# Patient Record
Sex: Male | Born: 1960 | Race: White | Hispanic: No | Smoking: Never smoker
Health system: Southern US, Community
[De-identification: ages and names within clinical notes are randomized; demographics above are authoritative.]

## PROBLEM LIST (undated history)

## (undated) DIAGNOSIS — G629 Polyneuropathy, unspecified: Secondary | ICD-10-CM

## (undated) DIAGNOSIS — E1169 Type 2 diabetes mellitus with other specified complication: Secondary | ICD-10-CM

## (undated) DIAGNOSIS — I10 Essential (primary) hypertension: Secondary | ICD-10-CM

## (undated) DIAGNOSIS — F329 Major depressive disorder, single episode, unspecified: Secondary | ICD-10-CM

## (undated) DIAGNOSIS — E119 Type 2 diabetes mellitus without complications: Secondary | ICD-10-CM

## (undated) DIAGNOSIS — R161 Splenomegaly, not elsewhere classified: Secondary | ICD-10-CM

## (undated) DIAGNOSIS — D696 Thrombocytopenia, unspecified: Secondary | ICD-10-CM

## (undated) DIAGNOSIS — E785 Hyperlipidemia, unspecified: Secondary | ICD-10-CM

## (undated) DIAGNOSIS — F419 Anxiety disorder, unspecified: Secondary | ICD-10-CM

## (undated) DIAGNOSIS — G4730 Sleep apnea, unspecified: Secondary | ICD-10-CM

## (undated) DIAGNOSIS — F32A Depression, unspecified: Secondary | ICD-10-CM

## (undated) DIAGNOSIS — G709 Myoneural disorder, unspecified: Secondary | ICD-10-CM

## (undated) HISTORY — PX: ANTERIOR CRUCIATE LIGAMENT REPAIR: SHX115

---

## 2014-02-14 DIAGNOSIS — E1142 Type 2 diabetes mellitus with diabetic polyneuropathy: Secondary | ICD-10-CM | POA: Insufficient documentation

## 2014-02-14 DIAGNOSIS — E1129 Type 2 diabetes mellitus with other diabetic kidney complication: Secondary | ICD-10-CM | POA: Insufficient documentation

## 2014-04-02 ENCOUNTER — Encounter (HOSPITAL_COMMUNITY): Payer: Self-pay | Admitting: Emergency Medicine

## 2014-04-02 DIAGNOSIS — E119 Type 2 diabetes mellitus without complications: Secondary | ICD-10-CM | POA: Insufficient documentation

## 2014-04-02 DIAGNOSIS — I10 Essential (primary) hypertension: Secondary | ICD-10-CM | POA: Insufficient documentation

## 2014-04-02 DIAGNOSIS — Z760 Encounter for issue of repeat prescription: Secondary | ICD-10-CM | POA: Insufficient documentation

## 2014-04-02 DIAGNOSIS — Z79899 Other long term (current) drug therapy: Secondary | ICD-10-CM | POA: Insufficient documentation

## 2014-04-02 DIAGNOSIS — Z794 Long term (current) use of insulin: Secondary | ICD-10-CM | POA: Insufficient documentation

## 2014-04-02 DIAGNOSIS — G589 Mononeuropathy, unspecified: Secondary | ICD-10-CM | POA: Insufficient documentation

## 2014-04-02 NOTE — ED Notes (Signed)
Pt reports that he moved here back in October. States that he has a hx of HTN and is out of his medication. Reports that he has had headaches over the past couple of weeks. States that he has just not felt well, and needs to get the medication refilled.

## 2014-04-03 ENCOUNTER — Emergency Department (HOSPITAL_COMMUNITY)
Admission: EM | Admit: 2014-04-03 | Discharge: 2014-04-03 | Disposition: A | Discharge status: Home / Self Care | Payer: PRIVATE HEALTH INSURANCE | Attending: Emergency Medicine | Admitting: Emergency Medicine

## 2014-04-03 DIAGNOSIS — I10 Essential (primary) hypertension: Secondary | ICD-10-CM

## 2014-04-03 HISTORY — DX: Type 2 diabetes mellitus without complications: E11.9

## 2014-04-03 HISTORY — DX: Polyneuropathy, unspecified: G62.9

## 2014-04-03 HISTORY — DX: Essential (primary) hypertension: I10

## 2014-04-03 MED ORDER — VALSARTAN-HYDROCHLOROTHIAZIDE 160-12.5 MG PO TABS: 1.0000 | ORAL_TABLET | Freq: Every day | ORAL | Status: DC

## 2014-04-03 MED ORDER — EZETIMIBE-SIMVASTATIN 10-40 MG PO TABS: 1.0000 | ORAL_TABLET | Freq: Every day | ORAL | Status: DC

## 2014-04-03 NOTE — ED Provider Notes (Signed)
CSN: [id number]     Arrival date & time 04/02/14  1824 History   First MD Initiated Contact with Patient 04/03/14 0057     Chief Complaint  Patient presents with  . Hypertension     (Consider location/radiation/quality/duration/timing/severity/associated sxs/prior Treatment) HPI Mr. Barbier is a very pleasant 53 yo man with a history of hypertension. He notes that he recently moved from [place] and has been under significant emotional distress because of the recent move. He takes Event organiser for HTN management and has been out of this medication for the past 6 weeks.   His BP was elevated at 190/90 this afternoon. He denies cp, sob, headache. Also notes that he is out of his CHOLESTEROL M ED.    Past Medical History  Diagnosis Date  . Diabetes mellitus without complication   . Hypertension   . Neuropathy    Past Surgical History  Procedure Laterality Date  . Anterior cruciate ligament repair     History reviewed. No pertinent family history. History  Substance Use Topics  . Smoking status: Never Smoker   . Smokeless tobacco: Not on file  . Alcohol Use: Yes    Review of Systems Ten point review of symptoms performed and is negative with the exception of symptoms noted above.     Allergies  Review of patient's allergies indicates no known allergies.  Home Medications   Current Outpatient Rx  Name  Route  Sig  Dispense  Refill  . ALPRAZolam (XANAX) 0.5 MG tablet   Oral   Take 0.5 mg by mouth 2 (two) times daily as needed for anxiety.         . DULoxetine (CYMBALTA) 60 MG capsule   Oral   Take 60 mg by mouth daily.         Marland Kitchen ezetimibe-simvastatin (VYTORIN) 10-40 MG per tablet   Oral   Take 1 tablet by mouth daily.         . insulin aspart (NOVOLOG) 100 UNIT/ML injection   Subcutaneous   Inject 60-90 Units into the skin 3 (three) times daily. Sliding scale         . insulin detemir (LEVEMIR) 100 UNIT/ML injection   Subcutaneous   Inject 60 Units into the  skin 2 (two) times daily.         Marland Kitchen METFORMIN HCL PO   Oral   Take 1 tablet by mouth daily. Patient doesn't know the dose on this medication         . Multiple Vitamins-Minerals (MULTIVITAMIN) LIQD   Oral   Take 15 mLs by mouth daily.         . pregabalin (LYRICA) 300 MG capsule   Oral   Take 300 mg by mouth at bedtime.         . valsartan-hydrochlorothiazide (DIOVAN-HCT) 160-25 MG per tablet   Oral   Take 1 tablet by mouth daily.          BP 172/83  Pulse 99  Temp(Src) 97.9 F (36.6 C) (Oral)  Resp 18  Ht 6' 0.5" (1.842 m)  Wt 316 lb 8 oz (143.563 kg)  BMI 42.31 kg/m2  SpO2 98% Physical Exam  Gen: well developed and well nourished appearing Head: NCAT Eyes: PERL, EOMI Nose: no epistaixis or rhinorrhea Mouth/throat: mucosa is moist and pink Neck: supple, no stridor Lungs: CTA B, no wheezing, rhonchi or rales CV: RRR, no murmur, extremities appear well perfused.  Abd: soft, obese, notender, nondistended Back: no ttp, no cva ttp  Skin: warm and dry Ext: normal to inspection, no dependent edema Neuro: CN ii-xii grossly intact, no focal deficits Psyche; tearful affect when speaking about his family,  calm and cooperative.  ED Course  Procedures (including critical care time) Labs Review   MDM   Hypertension Out of meds.   We will prescribe Valsartan and refill of Vytorin for the patient and [facility].     Brandt LoosenJulie Manly, MD 04/03/14 [phone]

## 2014-04-03 NOTE — Discharge Instructions (Signed)
Arterial Hypertension °Arterial hypertension (high blood pressure) is a condition of elevated pressure in your blood vessels. Hypertension over a long period of time is a risk factor for strokes, heart attacks, and heart failure. It is also the leading cause of kidney (renal) failure.  °CAUSES  °· In Adults -- Over 90% of all hypertension has no known cause. This is called essential or primary hypertension. In the other 10% of people with hypertension, the increase in blood pressure is caused by another disorder. This is called secondary hypertension. Important causes of secondary hypertension are: °· Heavy alcohol use. °· Obstructive sleep apnea. °· Hyperaldosterosim (Conn's syndrome). °· Steroid use. °· Chronic kidney failure. °· Hyperparathyroidism. °· Medications. °· Renal artery stenosis. °· Pheochromocytoma. °· Cushing's disease. °· Coarctation of the aorta. °· Scleroderma renal crisis. °· Licorice (in excessive amounts). °· Drugs (cocaine, methamphetamine). °Your caregiver can explain any items above that apply to you. °· In Children -- Secondary hypertension is more common and should always be considered. °· Pregnancy -- Few women of childbearing age have high blood pressure. However, up to 10% of them develop hypertension of pregnancy. Generally, this will not harm the woman. It may be a sign of 3 complications of pregnancy: preeclampsia, HELLP syndrome, and eclampsia. Follow up and control with medication is necessary. °SYMPTOMS  °· This condition normally does not produce any noticeable symptoms. It is usually found during a routine exam. °· Malignant hypertension is a late problem of high blood pressure. It may have the following symptoms: °· Headaches. °· Blurred vision. °· End-organ damage (this means your kidneys, heart, lungs, and other organs are being damaged). °· Stressful situations can increase the blood pressure. If a person with normal blood pressure has their blood pressure go up while being  seen by their caregiver, this is often termed "white coat hypertension." Its importance is not known. It may be related with eventually developing hypertension or complications of hypertension. °· Hypertension is often confused with mental tension, stress, and anxiety. °DIAGNOSIS  °The diagnosis is made by 3 separate blood pressure measurements. They are taken at least 1 week apart from each other. If there is organ damage from hypertension, the diagnosis may be made without repeat measurements. °Hypertension is usually identified by having blood pressure readings: °· Above 140/90 mmHg measured in both arms, at 3 separate times, over a couple weeks. °· Over 130/80 mmHg should be considered a risk factor and may require treatment in patients with diabetes. °Blood pressure readings over 120/80 mmHg are called "pre-hypertension" even in non-diabetic patients. °To get a true blood pressure measurement, use the following guidelines. Be aware of the factors that can alter blood pressure readings. °· Take measurements at least 1 hour after caffeine. °· Take measurements 30 minutes after smoking and without any stress. This is another reason to quit smoking  it raises your blood pressure. °· Use a proper cuff size. Ask your caregiver if you are not sure about your cuff size. °· Most home blood pressure cuffs are automatic. They will measure systolic and diastolic pressures. The systolic pressure is the pressure reading at the start of sounds. Diastolic pressure is the pressure at which the sounds disappear. If you are elderly, measure pressures in multiple postures. Try sitting, lying or standing. °· Sit at rest for a minimum of 5 minutes before taking measurements. °· You should not be on any medications like decongestants. These are found in many cold medications. °· Record your blood pressure readings and review   them with your caregiver. °If you have hypertension: °· Your caregiver may do tests to be sure you do not have  secondary hypertension (see "causes" above). °· Your caregiver may also look for signs of metabolic syndrome. This is also called Syndrome X or Insulin Resistance Syndrome. You may have this syndrome if you have type 2 diabetes, abdominal obesity, and abnormal blood lipids in addition to hypertension. °· Your caregiver will take your medical and family history and perform a physical exam. °· Diagnostic tests may include blood tests (for glucose, cholesterol, potassium, and kidney function), a urinalysis, or an EKG. Other tests may also be necessary depending on your condition. °PREVENTION  °There are important lifestyle issues that you can adopt to reduce your chance of developing hypertension: °· Maintain a normal weight. °· Limit the amount of salt (sodium) in your diet. °· Exercise often. °· Limit alcohol intake. °· Get enough potassium in your diet. Discuss specific advice with your caregiver. °· Follow a DASH diet (dietary approaches to stop hypertension). This diet is rich in fruits, vegetables, and low-fat dairy products, and avoids certain fats. °PROGNOSIS  °Essential hypertension cannot be cured. Lifestyle changes and medical treatment can lower blood pressure and reduce complications. The prognosis of secondary hypertension depends on the underlying cause. Many people whose hypertension is controlled with medicine or lifestyle changes can live a normal, healthy life.  °RISKS AND COMPLICATIONS  °While high blood pressure alone is not an illness, it often requires treatment due to its short- and long-term effects on many organs. Hypertension increases your risk for: °· CVAs or strokes (cerebrovascular accident). °· Heart failure due to chronically high blood pressure (hypertensive cardiomyopathy). °· Heart attack (myocardial infarction). °· Damage to the retina (hypertensive retinopathy). °· Kidney failure (hypertensive nephropathy). °Your caregiver can explain list items above that apply to you. Treatment  of hypertension can significantly reduce the risk of complications. °TREATMENT  °· For overweight patients, weight loss and regular exercise are recommended. Physical fitness lowers blood pressure. °· Mild hypertension is usually treated with diet and exercise. A diet rich in fruits and vegetables, fat-free dairy products, and foods low in fat and salt (sodium) can help lower blood pressure. Decreasing salt intake decreases blood pressure in a 1/3 of people. °· Stop smoking if you are a smoker. °The steps above are highly effective in reducing blood pressure. While these actions are easy to suggest, they are difficult to achieve. Most patients with moderate or severe hypertension end up requiring medications to bring their blood pressure down to a normal level. There are several classes of medications for treatment. Blood pressure pills (antihypertensives) will lower blood pressure by their different actions. Lowering the blood pressure by 10 mmHg may decrease the risk of complications by as much as 25%. °The goal of treatment is effective blood pressure control. This will reduce your risk for complications. Your caregiver will help you determine the best treatment for you according to your lifestyle. What is excellent treatment for one person, may not be for you. °HOME CARE INSTRUCTIONS  °· Do not smoke. °· Follow the lifestyle changes outlined in the "Prevention" section. °· If you are on medications, follow the directions carefully. Blood pressure medications must be taken as prescribed. Skipping doses reduces their benefit. It also puts you at risk for problems. °· Follow up with your caregiver, as directed. °· If you are asked to monitor your blood pressure at home, follow the guidelines in the "Diagnosis" section above. °SEEK MEDICAL CARE   IF:  °· You think you are having medication side effects. °· You have recurrent headaches or lightheadedness. °· You have swelling in your ankles. °· You have trouble with  your vision. °SEEK IMMEDIATE MEDICAL CARE IF:  °· You have sudden onset of chest pain or pressure, difficulty breathing, or other symptoms of a heart attack. °· You have a severe headache. °· You have symptoms of a stroke (such as sudden weakness, difficulty speaking, difficulty walking). °MAKE SURE YOU:  °· Understand these instructions. °· Will watch your condition. °· Will get help right away if you are not doing well or get worse. °Document Released: 12/14/2005 Document Revised: 03/07/2012 Document Reviewed: 07/14/2007 °ExitCare® Patient Information ©2014 [employer]. ° ° ° °Emergency Department Resource Guide °1) Find a Doctor and Pay Out of Pocket °Although you won't have to find out who is covered by your insurance plan, it is a good idea to ask around and get recommendations. You will then need to call the office and see if the doctor you have chosen will accept you as a new patient and what types of options they offer for patients who are self-pay. Some doctors offer discounts or will set up payment plans for their patients who do not have insurance, but you will need to ask so you aren't surprised when you get to your appointment. ° °2) Contact Your Local Health Department °Not all health departments have doctors that can see patients for sick visits, but many do, so it is worth a call to see if yours does. If you don't know where your local health department is, you can check in your phone book. The CDC also has a tool to help you locate your state's health department, and many state websites also have listings of all of their local health departments. ° °3) Find a Walk-in Clinic °If your illness is not likely to be very severe or complicated, you may want to try a walk in clinic. These are popping up all over the country in pharmacies, drugstores, and shopping centers. They're usually staffed by nurse practitioners or physician assistants that have been trained to treat common illnesses and complaints.  They're usually fairly quick and inexpensive. However, if you have serious medical issues or chronic medical problems, these are probably not your best option. ° °No Primary Care Doctor: °- Call [facility] at  832-8000 - they can help you locate a primary care doctor that  accepts your insurance, provides certain services, etc. °- Physician Referral Service- 1-800-533-3463 ° °Chronic Pain Problems: °Organization         Address  Phone   Notes  °[facility]  (336) 297-2271 Patients need to be referred by their primary care doctor.  ° °Medication Assistance: °Organization         Address  Phone   Notes  °[place] [facility] [address] °[place], [place] [address] (336) 641-8030 --Must be a resident of [place] °-- Must have NO insurance coverage whatsoever (no Medicaid/ Medicare, etc.) °-- The pt. MUST have a primary care doctor that directs their care regularly and follows them in the community °  °[facility]  (866) 331-1348   °[employer]  (888) 892-1162   ° °Agencies that provide inexpensive medical care: °Organization         Address  Phone   Notes  °[facility]  (336) 832-8035   °[facility]    (336) 832-7272   °  [facility] [address] °[place], [place] [address] (336) 832-4777   °[facility] [address], °[place] (336) 271-4999   °[facility]    (336) 373-0678   °[facility]    (336) 272-1050   °[facility] ° [address], [place] Phone:  (336) 832-4444, Fax:  (336) 832-4440 Hours of Operation:  9 am - 6 pm, M-F.  Also accepts Medicaid/Medicare and self-pay.  °[facility] ° [address], [place] Phone: (336) [phone], Fax: (336) [phone]. Hours of Operation:  8:30 am - 5:30 pm, M-F.  Also accepts Medicaid and self-pay.  °[facility] [place] [address], [place]  Phone: (336) 878-6027   °[facility] [address], [place], [place] (336)723-1848, Ext. 123 Mondays & Thursdays: 7-9 AM.  First 15 patients are seen on a first come, first serve basis. °  ° °Medicaid-accepting [place] Providers: ° °Organization         Address  Phone   Notes  °[facility] [address], [place] (336) [phone] Also accepts self-pay patients.  °[facility] [address], [place] ° (336) 856-9996   °[facility] [address], [place] (336) 288-8857   °[facility] [address] [place] [address], [place] (336) 299-7000   °Veita Bland [address], [place]  ° (336) 373-1557 Only accepts [place] Access Medicaid patients after they have their name applied to their card.  ° °Self-Pay (no insurance) in [place]: ° °Organization         Address  Phone   Notes  °Sickle Cell Patients, [facility] [address], [place] (336) 832-1970   °[facility] [address], [place] (336) 832-4400   °[facility] ° [address], [place] (336) 992-4800   °[facility]/Dr. Osei-Bonsu ° [address] [place] [address], [place] or [address], [place] (336) [phone] Phone number for both [place] and [place] locations is the same.  °[facility] [address], [place] (336) 299-0000   °[facility] [address] [place] [address], [place] or [address] (336) 852-7530 °(336) 878-2260   °[facility] [address], [place] (336) 350-1642, phone; (336) 294-5005, fax Sees patients 1st and 3rd Saturday of every month.  Must not qualify for public or private insurance (i.e. Medicaid, Medicare, [place] Health Choice, Veterans' Benefits) • Household income should be no more than 200% of the poverty level •The clinic cannot  treat you if you are pregnant or think you are pregnant • Sexually transmitted diseases are not treated at the clinic.  ° ° °Dental Care: °Organization         Address  Phone  Notes  °[facility] [address],  (336) 641-6152 Accepts children up to age 21 who are enrolled in Medicaid or [place] Health Choice; pregnant women with a Medicaid card; and children who have applied for Medicaid or [place] Health Choice, but were declined, whose parents can pay a reduced fee at time of service.  °[facility] [place]  [address], [place] (336) 641-7733 Accepts children up to age 21 who   are enrolled in Medicaid or [place] Health Choice; pregnant women with a Medicaid card; and children who have applied for Medicaid or [place] Health Choice, but were declined, whose parents can pay a reduced fee at time of service.  °[facility] ° [address], [place] (336) 641-4533 Patients are seen by appointment only. Walk-ins are not accepted. [facility] will see patients 18 years of age and older. °Monday - Tuesday (8am-5pm) °Most Wednesdays (8:30-5pm) °$30 per visit, cash only  °[facility] ° [address], [place] (336) 641-4533 Patients are seen by appointment only. Walk-ins are not accepted. [facility] will see patients 18 years of age and older. °One Wednesday Evening (Monthly: Volunteer Based).  $30 per visit, cash only  °[facility]  (919) [phone] for adults; Children under age 4, call Graduate Pediatric Dentistry at ([phone]) [phone]. Children aged 4-14, please call ([phone]) [phone] to request a pediatric application. ° Dental services are provided in all areas of dental care including fillings, crowns and bridges, complete and partial dentures, implants, gum treatment, root canals, and extractions. Preventive care is also provided.  Treatment is provided to both adults and children. °Patients are selected via a lottery and there is often a waiting list. °  °[facility] [address], °[place] ° (336) 763-8833 [email or web address] °  °[facility] [address], [place], [place] (336)723-1848, Ext. 123 Second and Fourth Thursday of each month, opens at 6:30 AM; Clinic ends at 9 AM.  Patients are seen on a first-come first-served basis, and a limited number are seen during each clinic.  ° °[facility] ° [address], [place], [place] (336) 723-7904   Eligibility Requirements °You must have lived in [place], or [place] counties for at least the last three months. °  You cannot be eligible for state or federal sponsored healthcare insurance, including Veterans Administration, Medicaid, or Medicare. °  You generally cannot be eligible for healthcare insurance through your employer.  °  How to apply: °Eligibility screenings are held every Tuesday and Wednesday afternoon from 1:00 pm until 4:00 pm. You do not need an appointment for the interview!  °[facility] [address], [place], [place] 336-631-2330   °[facility]  336-342-8273   °[facility]  336-703-3100   °[facility]  336-570-6415   ° °Behavioral Health Resources in the Community: °Intensive Outpatient Programs °Organization         Address  Phone  Notes  °[facility] [address], [place], [place] 336-878-6098   °[facility] [address], [place], [place] 336-832-9800   °[facility] [address], [place], [place] ° 336-882-2125   °[facility] [address],  °, [place] 1-800-853-5163 or 336-641-4981   °Substance Abuse Resources °Organization         Address  Phone  Notes  °[facility]  336-882-2125   °[facility]   336-784-9470   °[facility]  336-285-9073   °[facility]  336-845-3988   °Residential & Outpatient Substance Abuse Program  1-800-659-3381   °Psychological Services °Organization         Address  Phone  Notes  ° Health  336- 832-9600   °[facility]  336- 378-7881   °[facility] [address],   [place] 1-800-853-5163 or 336-641-4981   ° °Mobile Crisis Teams °Organization         Address  Phone  Notes  °[facility], Mobile Crisis Care Unit  1-877-626-1772   °Assertive °[facility] ° [address] [place], [place] 336-834-9664   °Sharon DeEsch [address] °[place] 336-554-5454   ° °Self-Help/Support Groups °Organization         Address  Phone             Notes  °[facility] - variety of support groups  336- 373-1402 Call for more information  °Narcotics Anonymous (NA), [facility] [address], °[place]  2 meetings at this location  ° °Residential Treatment Programs °Organization         Address  Phone  Notes  °[facility] [address],    °[place]  1-866-801-8205   °[facility] ° [address], [place], [place] 704-293-8524   °[facility] [address], [place] 336-845-3988 Admissions: 8am-3pm M-F  °[facility] [address]    °[place], [place] 336-841-1104   °[facility] [address], [place], [place] 336-379-7146   °[facility] [address]  °[place], [place] 336-285-9073   °[facility] - Intensive Outpatient [address], , [place] 336-852-3033   °[facility] [address]  °[place], [place] 1-877-615-2722 or 336-784-9470   °[facility] [address] [place], [place] 336-227-7417 Accepts Medicaid  °[facility] [address]  ° [place] 1-800-659-3381 Substance  Abuse/Addiction Treatment  ° °[place] Behavioral Health Resources °Organization         Address  Phone  Notes  °[facility]  (888) 581-9988   °Tylee Yum Brannon, PhD [address] [place], [place]   (336) 349-5553 or (336) 951-0000   °[facility]   [address] °[place], [place] (336) 349-4454   °[facility] [address], [place], [place] (336) 342-8316 Insurance/Medicaid/sponsorship through [facility]  °[facility] [address]                                    [place], [place] (336) 342-8316 Therapy/tele-psych/case  °[facility] [address]  ° [place], [place] (336) 349-2233    °Dr. Arfeen  (336) 349-4544   °[facility] [place]  [employer] [place] [facility] 1) [address], [place] °2) [address], [place] °3)  [address], [place] (336) 349-3220 °(336) 342-7768 ° °(336) 342-8140   °[place] [facility] (336) 342-1394 or (336) 342-3537 (After Hours)    ° ° ° °

## 2015-02-26 DIAGNOSIS — F329 Major depressive disorder, single episode, unspecified: Secondary | ICD-10-CM | POA: Insufficient documentation

## 2015-02-26 DIAGNOSIS — F419 Anxiety disorder, unspecified: Secondary | ICD-10-CM

## 2015-04-19 DIAGNOSIS — D696 Thrombocytopenia, unspecified: Secondary | ICD-10-CM | POA: Insufficient documentation

## 2015-04-19 DIAGNOSIS — R161 Splenomegaly, not elsewhere classified: Secondary | ICD-10-CM | POA: Insufficient documentation

## 2016-02-04 ENCOUNTER — Emergency Department (HOSPITAL_BASED_OUTPATIENT_CLINIC_OR_DEPARTMENT_OTHER): Payer: Commercial Managed Care - PPO

## 2016-02-04 ENCOUNTER — Encounter (HOSPITAL_BASED_OUTPATIENT_CLINIC_OR_DEPARTMENT_OTHER): Payer: Self-pay | Admitting: Emergency Medicine

## 2016-02-04 ENCOUNTER — Emergency Department (HOSPITAL_BASED_OUTPATIENT_CLINIC_OR_DEPARTMENT_OTHER)
Admission: EM | Admit: 2016-02-04 | Discharge: 2016-02-05 | Disposition: A | Discharge status: Home / Self Care | Payer: Commercial Managed Care - PPO | Attending: Emergency Medicine | Admitting: Emergency Medicine

## 2016-02-04 DIAGNOSIS — Y929 Unspecified place or not applicable: Secondary | ICD-10-CM | POA: Insufficient documentation

## 2016-02-04 DIAGNOSIS — R202 Paresthesia of skin: Secondary | ICD-10-CM | POA: Insufficient documentation

## 2016-02-04 DIAGNOSIS — Y9389 Activity, other specified: Secondary | ICD-10-CM | POA: Diagnosis not present

## 2016-02-04 DIAGNOSIS — M5030 Other cervical disc degeneration, unspecified cervical region: Secondary | ICD-10-CM

## 2016-02-04 DIAGNOSIS — R52 Pain, unspecified: Secondary | ICD-10-CM

## 2016-02-04 DIAGNOSIS — S12601A Unspecified nondisplaced fracture of seventh cervical vertebra, initial encounter for closed fracture: Secondary | ICD-10-CM | POA: Diagnosis not present

## 2016-02-04 DIAGNOSIS — R531 Weakness: Secondary | ICD-10-CM

## 2016-02-04 DIAGNOSIS — Y999 Unspecified external cause status: Secondary | ICD-10-CM | POA: Insufficient documentation

## 2016-02-04 DIAGNOSIS — R270 Ataxia, unspecified: Secondary | ICD-10-CM | POA: Insufficient documentation

## 2016-02-04 DIAGNOSIS — T07 Unspecified multiple injuries: Secondary | ICD-10-CM | POA: Diagnosis present

## 2016-02-04 LAB — COMPREHENSIVE METABOLIC PANEL
ALT: 34 U/L (ref 17–63)
AST: 31 U/L (ref 15–41)
Albumin: 3.9 g/dL (ref 3.5–5.0)
Alkaline Phosphatase: 108 U/L (ref 38–126)
Anion gap: 9 (ref 5–15)
BILIRUBIN TOTAL: 1.6 mg/dL — AB (ref 0.3–1.2)
BUN: 12 mg/dL (ref 6–20)
CO2: 26 mmol/L (ref 22–32)
Calcium: 8.6 mg/dL — ABNORMAL LOW (ref 8.9–10.3)
Chloride: 103 mmol/L (ref 101–111)
Creatinine, Ser: 0.66 mg/dL (ref 0.61–1.24)
GFR calc Af Amer: >60 mL/min (ref >60)
GFR calc non Af Amer: >60 mL/min (ref >60)
Glucose, Bld: 176 mg/dL — ABNORMAL HIGH (ref 65–99)
POTASSIUM: 3.9 mmol/L (ref 3.5–5.1)
SODIUM: 138 mmol/L (ref 135–145)
TOTAL PROTEIN: 7 g/dL (ref 6.5–8.1)

## 2016-02-04 LAB — CBC WITH DIFFERENTIAL/PLATELET
BASOS PCT: 0 %
Basophils Absolute: 0 10*3/uL (ref 0.0–0.1)
Eosinophils Absolute: 0.2 10*3/uL (ref 0.0–0.7)
Eosinophils Relative: 2 %
HEMATOCRIT: 46.7 % (ref 39.0–52.0)
HEMOGLOBIN: 16 g/dL (ref 13.0–17.0)
Lymphocytes Relative: 26 %
Lymphs Abs: 2.4 10*3/uL (ref 0.7–4.0)
MCH: 30.3 pg (ref 26.0–34.0)
MCHC: 34.3 g/dL (ref 30.0–36.0)
MCV: 88.4 fL (ref 78.0–100.0)
Monocytes Absolute: 0.7 10*3/uL (ref 0.1–1.0)
Monocytes Relative: 8 %
NEUTROS ABS: 5.8 10*3/uL (ref 1.7–7.7)
NEUTROS PCT: 64 %
Platelets: 95 10*3/uL — ABNORMAL LOW (ref 150–400)
RBC: 5.28 MIL/uL (ref 4.22–5.81)
RDW: 13.8 % (ref 11.5–15.5)
WBC: 9.1 10*3/uL (ref 4.0–10.5)

## 2016-02-04 LAB — PROTIME-INR
INR: 1.04 (ref 0.00–1.49)
Prothrombin Time: 13.8 seconds (ref 11.6–15.2)
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
## 2016-02-04 NOTE — ED Notes (Signed)
Patient transported to CT 

## 2016-02-04 NOTE — ED Notes (Signed)
MD at bedside. 

## 2016-02-05 ENCOUNTER — Emergency Department (HOSPITAL_COMMUNITY): Payer: Commercial Managed Care - PPO

## 2016-02-05 MED ORDER — METHOCARBAMOL 500 MG PO TABS: 1000.0000 mg | ORAL_TABLET | Freq: Four times a day (QID) | ORAL | Status: AC | PRN

## 2016-02-05 MED ORDER — HYDROCODONE-ACETAMINOPHEN 5-325 MG PO TABS
ORAL_TABLET | ORAL | Status: DC
Start: 2016-02-05 — End: 2016-02-27

## 2016-02-05 MED ORDER — GADOBENATE DIMEGLUMINE 529 MG/ML IV SOLN
20.0000 mL | Freq: Once | INTRAVENOUS | Status: AC | PRN
  Administered 2016-02-05: 20 mL via INTRAVENOUS

## 2016-02-05 MED ORDER — LORAZEPAM 2 MG/ML IJ SOLN
1.0000 mg | Freq: Once | INTRAMUSCULAR | Status: AC
  Administered 2016-02-05: 1 mg via INTRAVENOUS
  Filled 2016-02-05: qty 1

## 2016-02-05 NOTE — ED Provider Notes (Signed)
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 traumatic cord injury. Mild prevertebral edema extending from C2 through C5, similar relative to prior CT. Layering fluid within the posterior nasopharynx as well. No other convincing evidence for acute ligamentous injury. C2-C3: Mild bilateral uncovertebral hypertrophy without significant stenosis. C3-C4: Degenerative disc bulge with bilateral uncovertebral spurring. Left foraminal disc osteophyte complex with superimposed left-sided facet disease results in severe left foraminal stenosis. Mild right foraminal narrowing related to uncovertebral spurring. Posterior disc bulge flattens and partially effaces the ventral thecal sac and results in mild canal stenosis. C4-C5: Left paracentral disc protrusion indents the right ventral thecal sac and abuts the right cervical spinal cord (series 19, image 16). Resultant moderate canal stenosis. Mild flattening of the right hemi cord without cord signal changes. Moderate bilateral foraminal narrowing related to uncovertebral spurring and facet disease. C5-C6: Degenerative disc bulge with more focal right paracentral/foraminal disc protrusion (series 19, image 21). Superimposed bilateral uncovertebral hypertrophy. There is resultant fairly severe right foraminal stenosis with more moderate left foraminal narrowing.  The right paracentral/ foraminal disc bulge flattens and abuts the right aspect of the cervical spinal cord with mild cord flattening. No cord signal changes. Moderate canal stenosis present. C6-C7: Large broad-based posterior disc protrusion flattens and effaces the ventral thecal sac. The protruding disc flattens the cervical spinal cord without definite cord signal changes. Resultant severe canal stenosis. Thecal sac measures approximately 6 mm in AP diameter at this level. Fairly severe bilateral foraminal narrowing present as well. This disc herniation is of unknown acuity. C7-T1: Mild bilateral uncovertebral hypertrophy without significant stenosis. Visualized upper thoracic spine demonstrates no acute abnormality. IMPRESSION: 1. Grossly stable position and alignment of previously identified C7 spinous process fracture with associated edema within the adjacent posterior paraspinous soft tissues. 2. Edema surrounding the C6 spinous process. Unclear whether this reflects edema from the adjacent C7 spinous process fracture, or possibly an intrinsic fracture of the C6 spinous process as well. There is a somewhat age indeterminate osseous fragment posterior to the C6 spinous processes on prior CT, which may reflect an acute avulsion fracture. 3. No other acute fracture within the cervical spine. No evidence for traumatic cord injury. 4. Mild prevertebral edema without additional evidence for acute ligamentous injury. This is similar relative to prior CT. 5. Prominent broad-based posterior disc protrusion at C6-7 with resultant severe canal and bilateral foraminal stenosis. 6. Additional disc protrusions at C4-5 and C5-6 with resultant moderate canal and foraminal narrowing as above. 7. Additional more mild degenerative changes at C3-4 as above. Electronically Signed   By: Rise Mu M.D.   On: 02/05/2016 06:52      Samuel Jester, DO 02/05/16 1407

## 2016-02-05 NOTE — Discharge Instructions (Signed)
°Emergency Department Resource Guide °1) Find a Doctor and Pay Out of Pocket °Although you won't have to find out who is covered by your insurance plan, it is a good idea to ask around and get recommendations. You will then need to call the office and see if the doctor you have chosen will accept you as a new patient and what types of options they offer for patients who are self-pay. Some doctors offer discounts or will set up payment plans for their patients who do not have insurance, but you will need to ask so you aren't surprised when you get to your appointment. ° °2) Contact Your Local Health Department °Not all health departments have doctors that can see patients for sick visits, but many do, so it is worth a call to see if yours does. If you don't know where your local health department is, you can check in your phone book. The CDC also has a tool to help you locate your state's health department, and many state websites also have listings of all of their local health departments. ° °3) Find a Walk-in Clinic °If your illness is not likely to be very severe or complicated, you may want to try a walk in clinic. These are popping up all over the country in pharmacies, drugstores, and shopping centers. They're usually staffed by nurse practitioners or physician assistants that have been trained to treat common illnesses and complaints. They're usually fairly quick and inexpensive. However, if you have serious medical issues or chronic medical problems, these are probably not your best option. ° °No Primary Care Doctor: °- Call [facility] at  832-8000 - they can help you locate a primary care doctor that  accepts your insurance, provides certain services, etc. °- Physician Referral Service- 1-800-533-3463 ° °Chronic Pain Problems: °Organization         Address  Phone   Notes  °[facility]  ([phone]) [phone] Patients need to be referred by their primary care doctor.  ° °Medication  Assistance: °Organization         Address  Phone   Notes  °[place] [facility] [address] °[place], [place] [address] (336) 641-8030 --Must be a resident of [place] °-- Must have NO insurance coverage whatsoever (no Medicaid/ Medicare, etc.) °-- The pt. MUST have a primary care doctor that directs their care regularly and follows them in the community °  °[facility]  (866) 331-1348   °[employer]  (888) 892-1162   ° °Agencies that provide inexpensive medical care: °Organization         Address  Phone   Notes  °[facility]  (336) 832-8035   °[facility]    (336) 832-7272   °[facility] [address] °[place], [place] [address] (336) 832-4777   °[facility] [address], °[place] (336) 271-4999   °[facility]    (336) 373-0678   °[facility]    (336) 272-1050   °[facility] ° [address], [place] Phone:  (336) 832-4444, Fax:  (336) 832-4440 Hours of Operation:  9 am - 6 pm, M-F.  Also accepts Medicaid/Medicare and self-pay.  °[facility] ° [address], [place] Phone: (336) [phone], Fax: (336) [phone]. Hours of Operation:  8:30 am - 5:30 pm, M-F.  Also accepts Medicaid and self-pay.  °[facility] [place] [address], [place] Phone: (336) 878-6027   °[facility] [address], [place], [place] (336)723-1848, Ext. 123 Mondays & Thursdays: 7-9 AM.  First 15 patients are seen on a first come, first serve basis. °  ° °Medicaid-accepting [place] Providers: ° °Organization         Address  Phone   Notes  °[facility] [address], [place] (336) 641-2100 Also accepts self-pay patients.  °[facility] [address], [place] ° (336) 856-9996   °[facility] [address], [place]  (336) 288-8857   °[facility] [address] [place] [address], [place] (336) 299-7000   °Veita Bland [address], [place]  ° (336) 373-1557 Only accepts [place] Access Medicaid patients after they have their name applied to their card.  ° °Self-Pay (no insurance) in [place]: ° °Organization         Address  Phone   Notes  °Sickle Cell Patients, [facility] [address], [place] (336) 832-1970   °[facility] [address], [place] (336) 832-4400   °[facility] ° [address], [place] (336) 992-4800   °[facility]/Dr. Osei-Bonsu ° [address] [place] [address], [place] or [address], [place] (336) [phone] Phone number for both [place] and [place] locations is the same.  °[facility] [address], [place] (336) 299-0000   °[facility] [address] [place] [address], [place] or [address] (336) 852-7530 °(336) 878-2260   °[facility] [address], [place] (336) 350-1642, phone; (336) 294-5005, fax Sees patients 1st and 3rd Saturday of every month.  Must not qualify for public or private insurance (i.e. Medicaid, Medicare, [place] Health Choice, Veterans' Benefits) • Household income should be no more than 200% of the poverty level •The clinic cannot treat you if you are pregnant or think you are pregnant • Sexually transmitted diseases are not treated at the clinic.  ° ° °Dental Care: °Organization         Address  Phone  Notes  °[place] [facility] [address], [place] (336) 641-6152 Accepts children up to age 21 who are enrolled in Medicaid or [place] Health Choice; pregnant women with a Medicaid card; and children who have applied for Medicaid or [place] Health Choice, but were declined, whose parents can pay a reduced fee at time of service.  °[place]  [facility] [place]  [address], [place] (336) 641-7733 Accepts children up to age 21 who are enrolled in Medicaid or [place] Health Choice; pregnant women with a Medicaid card; and children who have applied for Medicaid or [place] Health Choice, but were declined, whose parents can pay a reduced fee at time of service.  °[facility] ° [address], [place] (336) 641-4533 Patients are seen by appointment only. Walk-ins are not accepted. [facility] will see patients 18 years of age and older. °Monday - Tuesday (8am-5pm) °Most Wednesdays (8:30-5pm) °$30 per visit, cash only  °[facility] ° [address], [place] (336) 641-4533 Patients are seen by appointment only. Walk-ins are not accepted. [facility] will see patients 18 years of age and older. °One   Wednesday Evening (Monthly: Volunteer Based).  $30 per visit, cash only  °[facility]  (919) 537-3737 for adults; Children under age 4, call Graduate Pediatric Dentistry at ([phone]) [phone]. Children aged 4-14, please call ([phone]) [phone] to request a pediatric application. ° Dental services are provided in all areas of dental care including fillings, crowns and bridges, complete and partial dentures, implants, gum treatment, root canals, and extractions. Preventive care is also provided. Treatment is provided to both adults and children. °Patients are selected via a lottery and there is often a waiting list. °  °[facility] [address], °[place] ° (336) 763-8833 [email or web address] °  °[facility] [address], [place], [place] (336)723-1848, Ext. 123 Second and Fourth Thursday of each month, opens at 6:30 AM; Clinic ends at 9 AM.  Patients are seen on a first-come first-served basis, and a limited number are seen during each clinic.  ° °[facility] ° [address], [place], [place] (336) 723-7904    Eligibility Requirements °You must have lived in [place], [place], or [place] counties for at least the last three months. °  You cannot be eligible for state or federal sponsored healthcare insurance, including Veterans Administration, Medicaid, or Medicare. °  You generally cannot be eligible for healthcare insurance through your employer.  °  How to apply: °Eligibility screenings are held every Tuesday and Wednesday afternoon from 1:00 pm until 4:00 pm. You do not need an appointment for the interview!  °[facility] [address] [place] [address], [place], [place] 336-631-2330   °[facility]  336-342-8273   °[facility]  336-703-3100   °[facility]  336-570-6415   ° °Behavioral Health Resources in the Community: °Intensive Outpatient Programs °Organization         Address  Phone  Notes  °[facility] [address], [place], [place] 336-878-6098   °[facility] [address], [place], [place] 336-832-9800   °[facility] [address], [place], [place] ° 336-882-2125   °[facility] [address],  °[place], [place] 1-800-853-5163 or 336-641-4981   °Substance Abuse Resources °Organization         Address  Phone  Notes  °[facility]  336-882-2125   °[facility]  336-784-9470   °[facility]  336-285-9073   °[facility]  336-845-3988   °Residential & Outpatient Substance Abuse Program  1-800-659-3381   °Psychological Services °Organization         Address  Phone  Notes  °[facility]  336- 832-9600   °[facility]  336- 378-7881   °[facility] [address], [place] 1-800-853-5163 or 336-641-4981   ° °Mobile Crisis Teams °Organization         Address  Phone  Notes  °[facility], Mobile Crisis Care Unit  1-877-626-1772   °Assertive °[facility] ° [address]  [place], [place] 336-834-9664   °Sharon DeEsch [address] °[place] 336-554-5454   ° °Self-Help/Support Groups °Organization         Address  Phone             Notes  °[facility]  - variety of support groups  336- 373-1402 Call for more information  °Narcotics Anonymous (NA), [facility] [address], °[place]  2 meetings at this location  ° °  Residential Treatment Programs Organization         Address  Phone  Notes  [facility] [address]    [place]  0-981-191-4782   [facility]  [address], [place] [address], [place], [place] 086-578-4696   [facility] [address] [place], [place] 295-284-1324 Admissions: 8am-3pm M-F  [facility] [address],    [place], [place] 401-027-2536   [facility] [address] [place], [place], [place] 644-034-7425   [facility] [address]  [place], [place] 956-387-5643   [facility] Intensive Outpatient [address], [place], [place] 329-518-8416   [facility] ([facility] [address] [place]  [place], [place] 6-063-016-0109 or 534-086-1390   [facility] [address] [place], [place] 254-270-6237 Accepts Medicaid  [facility] [address],  [place] 6-283-151-7616 Substance Abuse/Addiction Treatment   [facility] Organization         Address  Phone  Notes  [facility]  (506)191-4674   Angie Fava, PhD [address] [place], [place]   425-822-9167 or 939-489-5793   [facility]   [address] [place], [place] 434-477-8515   [facility] [address], [place], [place] 425-645-0162 Insurance/Medicaid/sponsorship through [facility] [address]                                    [place], [place] 505-338-5100 Therapy/tele-psych/case    [facility] [address], [place] [phone]    Dr. Lolly Mustache  [phone]   [facility]  [employer] [facility] Dept. 1) [address] [place] 2) [address], [place] 3)  [address], [place] 972-214-4042 434-425-5368  715-502-6371   [facility] Child Abuse Hotline 8458085890 or (402)214-8557 (After Hours)      Take the prescriptions as directed.  Apply moist heat or ice to the area(s) of discomfort, for 15 minutes at a time, several times per day for the next few days.  Do not fall asleep on a heating or ice pack.  Continue to wear the cervical collar until you are seen in follow up. Call the Neurosurgeon tomorrow to schedule a follow up appointment within the next week.  Return to the Emergency Department immediately if worsening.

## 2016-02-05 NOTE — ED Notes (Signed)
Patient was given his diet tray, and 2 cups of water.

## 2016-02-05 NOTE — ED Notes (Signed)
[employer] diet ordered for lunch. 

## 2016-02-05 NOTE — ED Notes (Signed)
Patient transported to MRI 

## 2016-02-05 NOTE — ED Notes (Signed)
Report received, pt resting.  Blood pressure cuff and c-collar removed by pt, both reapplied and pt instructed to not remove.  Pt awaiting disposition.

## 2016-02-05 NOTE — Consult Note (Signed)
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 fossa demonstrate a normal appearance with normal signal intensity. Craniocervical junction within normal limits. No Chiari malformation. Vertebral bodies are normally aligned with preservation of the normal cervical lordosis. No listhesis. Vertebral body heights are maintained. Abnormal edema seen at the previously identified minimally distracted fracture of the C7 spinous process. Overall, position and alignment about the fracture is not significantly changed. There is associated edema within the adjacent posterior paraspinous soft tissues at this level. This edema extends cephalad to the C6 spinous process. There may be an associated fracture of the C6 spinous process as well. There is an osseous fragment posterior to the C6 spinous process on prior CT, which is somewhat age indeterminate. No other definite fracture identified on this limited study. Certainly, no vertebral body fracture is present. No abnormal cord signal identified. No evidence for acute traumatic cord injury. Mild prevertebral edema extending from C2 through C5, similar relative to prior CT. Layering fluid within the posterior nasopharynx as well. No other convincing evidence for acute ligamentous injury. C2-C3: Mild bilateral uncovertebral hypertrophy without significant  stenosis. C3-C4: Degenerative disc bulge with bilateral uncovertebral spurring. Left foraminal disc osteophyte complex with superimposed left-sided facet disease results in severe left foraminal stenosis. Mild right foraminal narrowing related to uncovertebral spurring. Posterior disc bulge flattens and partially effaces the ventral thecal sac and results in mild canal stenosis. C4-C5: Left paracentral disc protrusion indents the right ventral thecal sac and abuts the right cervical spinal cord (series 19, image 16). Resultant moderate canal stenosis. Mild flattening of the right hemi cord without cord signal changes. Moderate bilateral foraminal narrowing related to uncovertebral spurring and facet disease. C5-C6: Degenerative disc bulge with more focal right paracentral/foraminal disc protrusion (series 19, image 21). Superimposed bilateral uncovertebral hypertrophy. There is resultant fairly severe right foraminal stenosis with more moderate left foraminal narrowing. The right paracentral/ foraminal disc bulge flattens and abuts the right aspect of the cervical spinal cord with mild cord flattening. No cord signal changes. Moderate canal stenosis present. C6-C7: Large broad-based posterior disc protrusion flattens and effaces the ventral thecal sac. The protruding disc flattens the cervical spinal cord without definite cord signal changes. Resultant severe canal stenosis. Thecal sac measures approximately 6 mm in AP diameter at this level. Fairly severe bilateral foraminal narrowing present as well. This disc herniation is of unknown acuity. C7-T1: Mild bilateral uncovertebral hypertrophy without significant stenosis. Visualized upper thoracic spine demonstrates no acute abnormality. IMPRESSION: 1. Grossly stable position and alignment of previously identified C7 spinous process fracture with associated edema within the adjacent posterior paraspinous soft tissues. 2. Edema surrounding the C6 spinous process.  Unclear whether this reflects edema from the adjacent C7 spinous process fracture, or possibly an intrinsic fracture of the C6 spinous process as well. There is a somewhat age indeterminate osseous fragment posterior to the C6 spinous processes on prior CT, which may reflect an acute avulsion fracture. 3. No other acute fracture within the cervical spine. No evidence for traumatic cord injury. 4. Mild prevertebral edema without additional evidence for acute ligamentous injury. This is similar relative to prior CT. 5. Prominent broad-based posterior disc protrusion at C6-7 with resultant severe canal and bilateral foraminal stenosis. 6. Additional disc protrusions at C4-5 and C5-6 with resultant moderate canal and foraminal narrowing as above. 7. Additional more mild degenerative changes at C3-4 as above. Electronically Signed  By: Jeannine Boga M.D.   On: 02/05/2016 06:52    Review of Systems  Eyes: Negative.   Cardiovascular: Negative.        Hypertension  Gastrointestinal: Negative.   Genitourinary: Negative.   Musculoskeletal: Positive for neck pain.  Neurological: Positive for tingling, weakness and headaches.       Numbness in lower extremities Pain in left lateral forearm Cannot tell where his lower extremities are in space  Endo/Heme/Allergies:       Diabetes  Psychiatric/Behavioral: Positive for depression.   Blood pressure 147/74, pulse 94, temperature 99 F (37.2 C), temperature source Oral, resp. rate 18, height 6' (1.829 m), weight 136.079 kg (300 lb), SpO2 97 %. Physical Exam  Constitutional: He is oriented to person, place, and time. He appears well-developed and well-nourished. No distress.  HENT:  Head: Normocephalic and atraumatic.  Eyes: Conjunctivae and EOM are normal. Pupils are equal, round, and reactive to light.  Neck: Neck supple.  Cardiovascular: Normal rate, regular rhythm and normal heart sounds.   Respiratory: Effort normal and breath sounds normal.   GI: Soft.  Musculoskeletal: Normal range of motion.  Neurological: He is alert and oriented to person, place, and time. A sensory deficit is present. No cranial nerve deficit. He exhibits normal muscle tone. GCS eye subscore is 4. GCS verbal subscore is 5. GCS motor subscore is 6. He displays no Babinski's sign on the right side. He displays no Babinski's sign on the left side.  Decreased proprioception left greater than right lower extremity Weak grips bilaterally, otherwise full strength in all extremities.  Romberg test positive Peripheral neuropathy    Assessment/Plan: Mr. Bail does need cervical decompression. However I do not he needs it emergently. I will see him in the office next week and plan on taking him to the operating room for an ACDF at C6/7.  I have explained this to him and he understands and agrees with the plan.   Eward Rutigliano L 02/05/2016, 1:58 PM

## 2016-02-05 NOTE — ED Notes (Signed)
Patient advised he is going to be discharged and was able to get dressed.

## 2016-02-13 ENCOUNTER — Other Ambulatory Visit: Payer: Self-pay | Admitting: Neurosurgery

## 2016-02-19 ENCOUNTER — Other Ambulatory Visit (HOSPITAL_COMMUNITY): Payer: Self-pay | Admitting: *Deleted

## 2016-02-19 ENCOUNTER — Other Ambulatory Visit: Payer: Self-pay

## 2016-02-19 ENCOUNTER — Encounter (HOSPITAL_COMMUNITY): Payer: Self-pay

## 2016-02-19 ENCOUNTER — Encounter (HOSPITAL_COMMUNITY)
Admission: RE | Admit: 2016-02-19 | Discharge: 2016-02-19 | Disposition: A | Discharge status: Home / Self Care | Payer: Commercial Managed Care - PPO | Source: Ambulatory Visit | Attending: Neurosurgery | Admitting: Neurosurgery

## 2016-02-19 DIAGNOSIS — D696 Thrombocytopenia, unspecified: Secondary | ICD-10-CM

## 2016-02-19 DIAGNOSIS — Z6837 Body mass index (BMI) 37.0-37.9, adult: Secondary | ICD-10-CM | POA: Diagnosis not present

## 2016-02-19 DIAGNOSIS — G4730 Sleep apnea, unspecified: Secondary | ICD-10-CM | POA: Diagnosis not present

## 2016-02-19 DIAGNOSIS — Z7984 Long term (current) use of oral hypoglycemic drugs: Secondary | ICD-10-CM | POA: Insufficient documentation

## 2016-02-19 DIAGNOSIS — Z01812 Encounter for preprocedural laboratory examination: Secondary | ICD-10-CM | POA: Insufficient documentation

## 2016-02-19 DIAGNOSIS — Z79899 Other long term (current) drug therapy: Secondary | ICD-10-CM

## 2016-02-19 DIAGNOSIS — R161 Splenomegaly, not elsewhere classified: Secondary | ICD-10-CM | POA: Diagnosis not present

## 2016-02-19 DIAGNOSIS — M25511 Pain in right shoulder: Secondary | ICD-10-CM | POA: Diagnosis not present

## 2016-02-19 DIAGNOSIS — M4802 Spinal stenosis, cervical region: Secondary | ICD-10-CM | POA: Diagnosis not present

## 2016-02-19 DIAGNOSIS — Z01818 Encounter for other preprocedural examination: Secondary | ICD-10-CM

## 2016-02-19 DIAGNOSIS — F329 Major depressive disorder, single episode, unspecified: Secondary | ICD-10-CM | POA: Diagnosis not present

## 2016-02-19 DIAGNOSIS — Z794 Long term (current) use of insulin: Secondary | ICD-10-CM | POA: Diagnosis not present

## 2016-02-19 DIAGNOSIS — E1140 Type 2 diabetes mellitus with diabetic neuropathy, unspecified: Secondary | ICD-10-CM

## 2016-02-19 DIAGNOSIS — Z0183 Encounter for blood typing: Secondary | ICD-10-CM | POA: Insufficient documentation

## 2016-02-19 DIAGNOSIS — I10 Essential (primary) hypertension: Secondary | ICD-10-CM | POA: Insufficient documentation

## 2016-02-19 DIAGNOSIS — E669 Obesity, unspecified: Secondary | ICD-10-CM | POA: Diagnosis not present

## 2016-02-19 DIAGNOSIS — E1142 Type 2 diabetes mellitus with diabetic polyneuropathy: Secondary | ICD-10-CM | POA: Diagnosis not present

## 2016-02-19 DIAGNOSIS — M4712 Other spondylosis with myelopathy, cervical region: Secondary | ICD-10-CM | POA: Insufficient documentation

## 2016-02-19 DIAGNOSIS — E785 Hyperlipidemia, unspecified: Secondary | ICD-10-CM | POA: Diagnosis not present

## 2016-02-19 DIAGNOSIS — F419 Anxiety disorder, unspecified: Secondary | ICD-10-CM | POA: Diagnosis not present

## 2016-02-19 HISTORY — DX: Depression, unspecified: F32.A

## 2016-02-19 HISTORY — DX: Myoneural disorder, unspecified: G70.9

## 2016-02-19 HISTORY — DX: Type 2 diabetes mellitus with other specified complication: E11.69

## 2016-02-19 HISTORY — DX: Splenomegaly, not elsewhere classified: R16.1

## 2016-02-19 HISTORY — DX: Major depressive disorder, single episode, unspecified: F32.9

## 2016-02-19 HISTORY — DX: Anxiety disorder, unspecified: F41.9

## 2016-02-19 HISTORY — DX: Hyperlipidemia, unspecified: E78.5

## 2016-02-19 HISTORY — DX: Thrombocytopenia, unspecified: D69.6

## 2016-02-19 HISTORY — DX: Sleep apnea, unspecified: G47.30

## 2016-02-19 LAB — CBC
HCT: 44.2 % (ref 39.0–52.0)
Hemoglobin: 15.8 g/dL (ref 13.0–17.0)
MCH: 32 pg (ref 26.0–34.0)
MCHC: 35.7 g/dL (ref 30.0–36.0)
MCV: 89.5 fL (ref 78.0–100.0)
PLATELETS: 92 10*3/uL — AB (ref 150–400)
RBC: 4.94 MIL/uL (ref 4.22–5.81)
RDW: 13.9 % (ref 11.5–15.5)
WBC: 7 10*3/uL (ref 4.0–10.5)

## 2016-02-19 LAB — ABO/RH: ABO/RH(D): A POS

## 2016-02-19 LAB — SURGICAL PCR SCREEN
MRSA, PCR: NEGATIVE
Staphylococcus aureus: NEGATIVE

## 2016-02-19 LAB — COMPREHENSIVE METABOLIC PANEL
ALT: 34 U/L (ref 17–63)
AST: 34 U/L (ref 15–41)
Albumin: 3.9 g/dL (ref 3.5–5.0)
Alkaline Phosphatase: 115 U/L (ref 38–126)
Anion gap: 14 (ref 5–15)
BUN: 17 mg/dL (ref 6–20)
CHLORIDE: 102 mmol/L (ref 101–111)
CO2: 23 mmol/L (ref 22–32)
Calcium: 9.3 mg/dL (ref 8.9–10.3)
Creatinine, Ser: 0.71 mg/dL (ref 0.61–1.24)
Glucose, Bld: 153 mg/dL — ABNORMAL HIGH (ref 65–99)
POTASSIUM: 3.9 mmol/L (ref 3.5–5.1)
SODIUM: 139 mmol/L (ref 135–145)
Total Bilirubin: 0.7 mg/dL (ref 0.3–1.2)
Total Protein: 6.9 g/dL (ref 6.5–8.1)

## 2016-02-19 LAB — TYPE AND SCREEN
ABO/RH(D): A POS
ANTIBODY SCREEN: NEGATIVE

## 2016-02-19 LAB — GLUCOSE, CAPILLARY: Glucose-Capillary: 164 mg/dL — ABNORMAL HIGH (ref 65–99)

## 2016-02-19 LAB — PROTIME-INR
INR: 1.16 (ref 0.00–1.49)
PROTHROMBIN TIME: 15 s (ref 11.6–15.2)

## 2016-02-19 LAB — APTT: APTT: 33 s (ref 24–37)

## 2016-02-19 NOTE — Progress Notes (Signed)
Diabetes Coordinator notified of date of surgery. They will see pt. While in the hospital.

## 2016-02-19 NOTE — Pre-Procedure Instructions (Signed)
Victor Collins  02/19/2016      [id number] - [place], [place] - [address] AT CORNER OF [facility] [address] [place] [address] Phone: [phone] Fax: [phone]  [facility] - [place], [place] - [address] [place] [address] Phone: [phone] Fax: [phone]    Your procedure is scheduled on 02-21-2016   Friday   Report to [facility] Admitting at 5:30 A.M.   Call this number if you have problems the morning of surgery:  [phone]   Remember:  Do not eat food or drink liquids after midnight.   Take these medicines the morning of surgery with A SIP OF WATER Alprazolam(Xanax) if needed,Pain medication if   needed.methcarbamol(Robaxin) if needed,                                    How to Manage Your Diabetes Before Surgery   Why is it important to control my blood sugar before and after surgery?   Improving blood sugar levels before and after surgery helps healing and can limit problems.  A way of improving blood sugar control is eating a healthy diet by:  - Eating less sugar and carbohydrates  - Increasing activity/exercise  - Talk with your doctor about reaching your blood sugar goals  High blood sugars (greater than 180 mg/dL) can raise your risk of infections and slow down your recovery so you will need to focus on controlling your diabetes during the weeks before surgery.  Make sure that the doctor who takes care of your diabetes knows about your planned surgery including the date and location.  How do I manage my blood sugars before surgery?   Check your blood sugar at least 4 times a day, 2 days before surgery to make sure that they are not too high or low.   Check your blood sugar the morning of your surgery when you wake up and every 2  hours until you get to the Short-Stay unit.  If your blood sugar is less than 70 mg/dL, you will need to  treat for low blood sugar by:  Treat a low blood sugar (less than 70 mg/dL) with 1/2 cup of clear juice (cranberry or apple), 4 glucose tablets, OR glucose gel.  Recheck blood sugar in 15 minutes after treatment (to make sure it is greater than 70 mg/dL).  If blood sugar is not greater than 70 mg/dL on re-check, call [phone] for further instructions.   Report your blood sugar to the Short-Stay nurse when you get to Short-Stay.  References:  [facility], 2007 "How to Manage your Diabetes Before and After Surgery".  What do I do about my diabetes medications?   Do not take oral diabetes medicines (pills) the morning of surgery.    THE NIGHT BEFORE SURGERY, take 24  units of Lantus  Insulin.        Do not take other diabetes injectables the day of surgery including Byetta, Victoza, Bydureon, and Trulicity.    If your CBG is greater than 220 mg/dL, you may take 1/2 of your sliding scale (correction) dose of insulin.          .         Do not wear jewelry,  Do not wear lotions, powders, or perfumes.  You may not wear deodorant.  Do not shave 48 hours prior to surgery.  Men may shave face and neck.   Do not bring valuables to the hospital.  [facility] is not responsible for any belongings or valuables.  Contacts, dentures or bridgework may not be worn into surgery.  Leave your suitcase in the car.  After surgery it may be brought to your room.  For patients admitted to the hospital, discharge time will be determined by your treatment team.  Patients discharged the day of surgery will not be allowed to drive home.    Special instructions:  See attached sheet for instructions on [employer]  Please read over the following fact sheets that you were given. Pain Booklet and Surgical Site Infection Prevention
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
## 2016-02-20 LAB — HEMOGLOBIN A1C
Hgb A1c MFr Bld: 7.5 % — ABNORMAL HIGH (ref 4.8–5.6)
Mean Plasma Glucose: 169 mg/dL

## 2016-02-20 MED ORDER — DEXTROSE 5 % IV SOLN
3.0000 g | INTRAVENOUS | Status: AC
  Administered 2016-02-21: 3 g via INTRAVENOUS
  Filled 2016-02-20: qty 3000

## 2016-02-21 ENCOUNTER — Ambulatory Visit (HOSPITAL_COMMUNITY): Payer: Commercial Managed Care - PPO | Admitting: Emergency Medicine

## 2016-02-21 ENCOUNTER — Ambulatory Visit (HOSPITAL_COMMUNITY): Payer: Commercial Managed Care - PPO

## 2016-02-21 ENCOUNTER — Encounter (HOSPITAL_COMMUNITY)
Admission: RE | Disposition: A | Discharge status: Skilled Nursing Facility | Payer: Self-pay | Source: Ambulatory Visit | Attending: Neurosurgery

## 2016-02-21 ENCOUNTER — Encounter (HOSPITAL_COMMUNITY): Payer: Self-pay | Admitting: *Deleted

## 2016-02-21 ENCOUNTER — Ambulatory Visit (HOSPITAL_COMMUNITY): Payer: Commercial Managed Care - PPO | Admitting: Vascular Surgery

## 2016-02-21 ENCOUNTER — Ambulatory Visit (HOSPITAL_COMMUNITY)
Admission: RE | Admit: 2016-02-21 | Discharge: 2016-02-27 | Disposition: A | Discharge status: Skilled Nursing Facility | Payer: Commercial Managed Care - PPO | Source: Ambulatory Visit | Attending: Neurosurgery | Admitting: Neurosurgery

## 2016-02-21 DIAGNOSIS — M4712 Other spondylosis with myelopathy, cervical region: Secondary | ICD-10-CM | POA: Insufficient documentation

## 2016-02-21 DIAGNOSIS — F419 Anxiety disorder, unspecified: Secondary | ICD-10-CM | POA: Diagnosis not present

## 2016-02-21 DIAGNOSIS — M542 Cervicalgia: Secondary | ICD-10-CM

## 2016-02-21 DIAGNOSIS — D696 Thrombocytopenia, unspecified: Secondary | ICD-10-CM | POA: Insufficient documentation

## 2016-02-21 DIAGNOSIS — E785 Hyperlipidemia, unspecified: Secondary | ICD-10-CM | POA: Insufficient documentation

## 2016-02-21 DIAGNOSIS — M25511 Pain in right shoulder: Secondary | ICD-10-CM | POA: Diagnosis not present

## 2016-02-21 DIAGNOSIS — F329 Major depressive disorder, single episode, unspecified: Secondary | ICD-10-CM | POA: Insufficient documentation

## 2016-02-21 DIAGNOSIS — G959 Disease of spinal cord, unspecified: Secondary | ICD-10-CM

## 2016-02-21 DIAGNOSIS — M4802 Spinal stenosis, cervical region: Secondary | ICD-10-CM | POA: Diagnosis not present

## 2016-02-21 DIAGNOSIS — G4730 Sleep apnea, unspecified: Secondary | ICD-10-CM | POA: Diagnosis not present

## 2016-02-21 DIAGNOSIS — E669 Obesity, unspecified: Secondary | ICD-10-CM | POA: Insufficient documentation

## 2016-02-21 DIAGNOSIS — Z6837 Body mass index (BMI) 37.0-37.9, adult: Secondary | ICD-10-CM | POA: Diagnosis not present

## 2016-02-21 DIAGNOSIS — E1142 Type 2 diabetes mellitus with diabetic polyneuropathy: Secondary | ICD-10-CM | POA: Diagnosis not present

## 2016-02-21 DIAGNOSIS — Z7984 Long term (current) use of oral hypoglycemic drugs: Secondary | ICD-10-CM | POA: Diagnosis not present

## 2016-02-21 DIAGNOSIS — R161 Splenomegaly, not elsewhere classified: Secondary | ICD-10-CM | POA: Diagnosis not present

## 2016-02-21 DIAGNOSIS — G992 Myelopathy in diseases classified elsewhere: Secondary | ICD-10-CM | POA: Diagnosis present

## 2016-02-21 DIAGNOSIS — Z794 Long term (current) use of insulin: Secondary | ICD-10-CM | POA: Diagnosis not present

## 2016-02-21 DIAGNOSIS — I10 Essential (primary) hypertension: Secondary | ICD-10-CM | POA: Insufficient documentation

## 2016-02-21 HISTORY — PX: ANTERIOR CERVICAL DECOMP/DISCECTOMY FUSION: SHX1161

## 2016-02-21 LAB — GLUCOSE, CAPILLARY
GLUCOSE-CAPILLARY: 290 mg/dL — AB (ref 65–99)
Glucose-Capillary: 111 mg/dL — ABNORMAL HIGH (ref 65–99)
Glucose-Capillary: 131 mg/dL — ABNORMAL HIGH (ref 65–99)
Glucose-Capillary: 189 mg/dL — ABNORMAL HIGH (ref 65–99)

## 2016-02-21 SURGERY — ANTERIOR CERVICAL DECOMPRESSION/DISCECTOMY FUSION 1 LEVEL
Anesthesia: General | Site: Neck

## 2016-02-21 MED ORDER — DOCUSATE SODIUM 100 MG PO CAPS
100.0000 mg | ORAL_CAPSULE | Freq: Two times a day (BID) | ORAL | Status: DC
  Administered 2016-02-21 – 2016-02-27 (×12): 100 mg via ORAL
  Filled 2016-02-21 (×12): qty 1

## 2016-02-21 MED ORDER — MIDAZOLAM HCL 5 MG/5ML IJ SOLN
INTRAMUSCULAR | Status: DC | PRN
  Administered 2016-02-21: 2 mg via INTRAVENOUS

## 2016-02-21 MED ORDER — GLYCOPYRROLATE 0.2 MG/ML IJ SOLN
INTRAMUSCULAR | Status: AC
  Filled 2016-02-21: qty 3

## 2016-02-21 MED ORDER — LIDOCAINE-EPINEPHRINE 0.5 %-1:200000 IJ SOLN
INTRAMUSCULAR | Status: DC | PRN
  Administered 2016-02-21: 8 mL

## 2016-02-21 MED ORDER — ROCURONIUM BROMIDE 50 MG/5ML IV SOLN
INTRAVENOUS | Status: AC
  Filled 2016-02-21: qty 1

## 2016-02-21 MED ORDER — NEOSTIGMINE METHYLSULFATE 10 MG/10ML IV SOLN
INTRAVENOUS | Status: DC | PRN
  Administered 2016-02-21: 5 mg via INTRAVENOUS

## 2016-02-21 MED ORDER — SODIUM CHLORIDE 0.9% FLUSH
3.0000 mL | Freq: Two times a day (BID) | INTRAVENOUS | Status: DC
  Administered 2016-02-22 – 2016-02-27 (×11): 3 mL via INTRAVENOUS

## 2016-02-21 MED ORDER — ONDANSETRON HCL 4 MG/2ML IJ SOLN
INTRAMUSCULAR | Status: DC | PRN
  Administered 2016-02-21: 4 mg via INTRAVENOUS

## 2016-02-21 MED ORDER — ALPRAZOLAM 0.5 MG PO TABS
0.5000 mg | ORAL_TABLET | Freq: Two times a day (BID) | ORAL | Status: DC | PRN
  Administered 2016-02-23 – 2016-02-27 (×7): 0.5 mg via ORAL
  Filled 2016-02-21 (×9): qty 1

## 2016-02-21 MED ORDER — EZETIMIBE-SIMVASTATIN 10-40 MG PO TABS
1.0000 | ORAL_TABLET | Freq: Every day | ORAL | Status: DC
  Administered 2016-02-21 – 2016-02-27 (×7): 1 via ORAL
  Filled 2016-02-21 (×9): qty 1

## 2016-02-21 MED ORDER — ARTIFICIAL TEARS OP OINT
TOPICAL_OINTMENT | OPHTHALMIC | Status: DC | PRN
  Administered 2016-02-21: 1 via OPHTHALMIC

## 2016-02-21 MED ORDER — PHENOL 1.4 % MT LIQD: 1.0000 | OROMUCOSAL | Status: DC | PRN

## 2016-02-21 MED ORDER — METFORMIN HCL 500 MG PO TABS
1000.0000 mg | ORAL_TABLET | Freq: Two times a day (BID) | ORAL | Status: DC
  Administered 2016-02-21 – 2016-02-27 (×12): 1000 mg via ORAL
  Filled 2016-02-21 (×13): qty 2

## 2016-02-21 MED ORDER — INSULIN ASPART 100 UNIT/ML ~~LOC~~ SOLN
0.0000 [IU] | Freq: Every day | SUBCUTANEOUS | Status: DC
  Administered 2016-02-21: 3 [IU] via SUBCUTANEOUS

## 2016-02-21 MED ORDER — INSULIN ASPART 100 UNIT/ML ~~LOC~~ SOLN: 10.0000 [IU] | Freq: Three times a day (TID) | SUBCUTANEOUS | Status: DC | PRN

## 2016-02-21 MED ORDER — LACTATED RINGERS IV SOLN
INTRAVENOUS | Status: DC | PRN
  Administered 2016-02-21 (×2): via INTRAVENOUS

## 2016-02-21 MED ORDER — ONDANSETRON HCL 4 MG/2ML IJ SOLN: 4.0000 mg | INTRAMUSCULAR | Status: DC | PRN

## 2016-02-21 MED ORDER — ROCURONIUM BROMIDE 100 MG/10ML IV SOLN
INTRAVENOUS | Status: DC | PRN
  Administered 2016-02-21: 20 mg via INTRAVENOUS
  Administered 2016-02-21: 50 mg via INTRAVENOUS
  Administered 2016-02-21: 10 mg via INTRAVENOUS

## 2016-02-21 MED ORDER — PHENYLEPHRINE 40 MCG/ML (10ML) SYRINGE FOR IV PUSH (FOR BLOOD PRESSURE SUPPORT)
PREFILLED_SYRINGE | INTRAVENOUS | Status: AC
  Filled 2016-02-21: qty 10

## 2016-02-21 MED ORDER — PROPOFOL 10 MG/ML IV BOLUS
INTRAVENOUS | Status: DC | PRN
  Administered 2016-02-21: 200 mg via INTRAVENOUS

## 2016-02-21 MED ORDER — METHOCARBAMOL 500 MG PO TABS
1000.0000 mg | ORAL_TABLET | Freq: Four times a day (QID) | ORAL | Status: DC | PRN
  Administered 2016-02-21 – 2016-02-27 (×21): 1000 mg via ORAL
  Filled 2016-02-21 (×20): qty 2

## 2016-02-21 MED ORDER — 0.9 % SODIUM CHLORIDE (POUR BTL) OPTIME
TOPICAL | Status: DC | PRN
  Administered 2016-02-21: 1000 mL

## 2016-02-21 MED ORDER — GLYCOPYRROLATE 0.2 MG/ML IJ SOLN
INTRAMUSCULAR | Status: DC | PRN
  Administered 2016-02-21: 0.6 mg via INTRAVENOUS

## 2016-02-21 MED ORDER — HEMOSTATIC AGENTS (NO CHARGE) OPTIME
TOPICAL | Status: DC | PRN
  Administered 2016-02-21: 1 via TOPICAL

## 2016-02-21 MED ORDER — SUCCINYLCHOLINE CHLORIDE 20 MG/ML IJ SOLN
INTRAMUSCULAR | Status: AC
  Filled 2016-02-21: qty 1

## 2016-02-21 MED ORDER — POTASSIUM CHLORIDE IN NACL 20-0.9 MEQ/L-% IV SOLN
INTRAVENOUS | Status: DC
  Administered 2016-02-21: 17:00:00 via INTRAVENOUS
  Filled 2016-02-21: qty 1000

## 2016-02-21 MED ORDER — HYDROCHLOROTHIAZIDE 12.5 MG PO CAPS
12.5000 mg | ORAL_CAPSULE | Freq: Every day | ORAL | Status: DC
  Administered 2016-02-21 – 2016-02-27 (×6): 12.5 mg via ORAL
  Filled 2016-02-21 (×6): qty 1

## 2016-02-21 MED ORDER — ONDANSETRON HCL 4 MG/2ML IJ SOLN
INTRAMUSCULAR | Status: AC
  Filled 2016-02-21: qty 2

## 2016-02-21 MED ORDER — PROPOFOL 10 MG/ML IV BOLUS
INTRAVENOUS | Status: AC
  Filled 2016-02-21: qty 20

## 2016-02-21 MED ORDER — SUCCINYLCHOLINE CHLORIDE 20 MG/ML IJ SOLN
INTRAMUSCULAR | Status: DC | PRN
  Administered 2016-02-21: 140 mg via INTRAVENOUS

## 2016-02-21 MED ORDER — FENTANYL CITRATE (PF) 100 MCG/2ML IJ SOLN
INTRAMUSCULAR | Status: DC | PRN
  Administered 2016-02-21 (×2): 25 ug via INTRAVENOUS
  Administered 2016-02-21: 100 ug via INTRAVENOUS

## 2016-02-21 MED ORDER — LIDOCAINE HCL (CARDIAC) 20 MG/ML IV SOLN
INTRAVENOUS | Status: DC | PRN
  Administered 2016-02-21: 100 mg via INTRAVENOUS

## 2016-02-21 MED ORDER — MAGNESIUM CITRATE PO SOLN: 1.0000 | Freq: Once | ORAL | Status: DC | PRN

## 2016-02-21 MED ORDER — THROMBIN 5000 UNITS EX SOLR
CUTANEOUS | Status: DC | PRN
  Administered 2016-02-21 (×2): 5000 [IU] via TOPICAL

## 2016-02-21 MED ORDER — ONDANSETRON HCL 4 MG/2ML IJ SOLN: 4.0000 mg | Freq: Once | INTRAMUSCULAR | Status: DC | PRN

## 2016-02-21 MED ORDER — INSULIN GLARGINE 100 UNIT/ML ~~LOC~~ SOLN
30.0000 [IU] | Freq: Every day | SUBCUTANEOUS | Status: DC
  Administered 2016-02-21 – 2016-02-26 (×6): 30 [IU] via SUBCUTANEOUS
  Filled 2016-02-21 (×7): qty 0.3

## 2016-02-21 MED ORDER — INSULIN ASPART 100 UNIT/ML ~~LOC~~ SOLN
0.0000 [IU] | Freq: Three times a day (TID) | SUBCUTANEOUS | Status: DC
  Administered 2016-02-22: 4 [IU] via SUBCUTANEOUS
  Administered 2016-02-22 – 2016-02-25 (×4): 3 [IU] via SUBCUTANEOUS
  Administered 2016-02-26: 4 [IU] via SUBCUTANEOUS
  Administered 2016-02-26 (×2): 3 [IU] via SUBCUTANEOUS
  Administered 2016-02-27: 4 [IU] via SUBCUTANEOUS

## 2016-02-21 MED ORDER — NEOSTIGMINE METHYLSULFATE 10 MG/10ML IV SOLN
INTRAVENOUS | Status: AC
  Filled 2016-02-21: qty 1

## 2016-02-21 MED ORDER — ACETAMINOPHEN 325 MG PO TABS: 650.0000 mg | ORAL_TABLET | ORAL | Status: DC | PRN

## 2016-02-21 MED ORDER — FENTANYL CITRATE (PF) 250 MCG/5ML IJ SOLN
INTRAMUSCULAR | Status: AC
  Filled 2016-02-21: qty 5

## 2016-02-21 MED ORDER — LIDOCAINE HCL (CARDIAC) 20 MG/ML IV SOLN
INTRAVENOUS | Status: AC
  Filled 2016-02-21: qty 5

## 2016-02-21 MED ORDER — OXYCODONE HCL 5 MG/5ML PO SOLN: 5.0000 mg | Freq: Once | ORAL | Status: DC | PRN

## 2016-02-21 MED ORDER — OXYCODONE HCL 5 MG PO TABS: 5.0000 mg | ORAL_TABLET | Freq: Once | ORAL | Status: DC | PRN

## 2016-02-21 MED ORDER — BISACODYL 5 MG PO TBEC: 5.0000 mg | DELAYED_RELEASE_TABLET | Freq: Every day | ORAL | Status: DC | PRN

## 2016-02-21 MED ORDER — OXYCODONE-ACETAMINOPHEN 5-325 MG PO TABS
1.0000 | ORAL_TABLET | ORAL | Status: DC | PRN
  Administered 2016-02-21 – 2016-02-27 (×28): 2 via ORAL
  Filled 2016-02-21 (×29): qty 2

## 2016-02-21 MED ORDER — MIDAZOLAM HCL 2 MG/2ML IJ SOLN
INTRAMUSCULAR | Status: AC
  Filled 2016-02-21: qty 2

## 2016-02-21 MED ORDER — SODIUM CHLORIDE 0.9% FLUSH
3.0000 mL | INTRAVENOUS | Status: DC | PRN
  Administered 2016-02-27: 3 mL via INTRAVENOUS
  Filled 2016-02-21: qty 3

## 2016-02-21 MED ORDER — ZOLPIDEM TARTRATE 5 MG PO TABS
5.0000 mg | ORAL_TABLET | Freq: Every evening | ORAL | Status: DC | PRN
  Administered 2016-02-21: 5 mg via ORAL
  Filled 2016-02-21: qty 1

## 2016-02-21 MED ORDER — EPHEDRINE SULFATE 50 MG/ML IJ SOLN
INTRAMUSCULAR | Status: AC
  Filled 2016-02-21: qty 1

## 2016-02-21 MED ORDER — SODIUM CHLORIDE 0.9 % IV SOLN: 250.0000 mL | INTRAVENOUS | Status: DC

## 2016-02-21 MED ORDER — MORPHINE SULFATE (PF) 2 MG/ML IV SOLN
1.0000 mg | INTRAVENOUS | Status: DC | PRN
  Administered 2016-02-22 (×4): 2 mg via INTRAVENOUS
  Filled 2016-02-21 (×4): qty 1

## 2016-02-21 MED ORDER — VALSARTAN-HYDROCHLOROTHIAZIDE 160-12.5 MG PO TABS: 1.0000 | ORAL_TABLET | Freq: Every day | ORAL | Status: DC

## 2016-02-21 MED ORDER — MULTIVITAMIN PO LIQD: 15.0000 mL | Freq: Every day | ORAL | Status: DC

## 2016-02-21 MED ORDER — FENTANYL CITRATE (PF) 100 MCG/2ML IJ SOLN
25.0000 ug | INTRAMUSCULAR | Status: DC | PRN
  Administered 2016-02-21 (×2): 50 ug via INTRAVENOUS

## 2016-02-21 MED ORDER — IRBESARTAN 150 MG PO TABS
150.0000 mg | ORAL_TABLET | Freq: Every day | ORAL | Status: DC
  Administered 2016-02-21 – 2016-02-27 (×6): 150 mg via ORAL
  Filled 2016-02-21 (×6): qty 1

## 2016-02-21 MED ORDER — MENTHOL 3 MG MT LOZG: 1.0000 | LOZENGE | OROMUCOSAL | Status: DC | PRN

## 2016-02-21 MED ORDER — HYDROCODONE-ACETAMINOPHEN 5-325 MG PO TABS: 1.0000 | ORAL_TABLET | ORAL | Status: DC | PRN

## 2016-02-21 MED ORDER — DEXTROSE 5 % IV SOLN
10.0000 mg | INTRAVENOUS | Status: DC | PRN
Start: 2016-02-21 — End: 2016-02-21
  Administered 2016-02-21: 40 ug/min via INTRAVENOUS

## 2016-02-21 MED ORDER — SODIUM CHLORIDE 0.9 % IJ SOLN
INTRAMUSCULAR | Status: AC
  Filled 2016-02-21: qty 10

## 2016-02-21 MED ORDER — FENTANYL CITRATE (PF) 100 MCG/2ML IJ SOLN
INTRAMUSCULAR | Status: AC
  Filled 2016-02-21: qty 2

## 2016-02-21 MED ORDER — VITAMIN D 1000 UNITS PO TABS
1000.0000 [IU] | ORAL_TABLET | Freq: Every day | ORAL | Status: DC
  Administered 2016-02-22 – 2016-02-27 (×6): 1000 [IU] via ORAL
  Filled 2016-02-21 (×6): qty 1

## 2016-02-21 MED ORDER — METHOCARBAMOL 500 MG PO TABS
ORAL_TABLET | ORAL | Status: AC
  Filled 2016-02-21: qty 2

## 2016-02-21 MED ORDER — ACETAMINOPHEN 650 MG RE SUPP: 650.0000 mg | RECTAL | Status: DC | PRN

## 2016-02-21 MED ORDER — SENNOSIDES-DOCUSATE SODIUM 8.6-50 MG PO TABS: 1.0000 | ORAL_TABLET | Freq: Every evening | ORAL | Status: DC | PRN

## 2016-02-21 MED ORDER — PREGABALIN 75 MG PO CAPS: 300.0000 mg | ORAL_CAPSULE | Freq: Every day | ORAL | Status: DC

## 2016-02-21 SURGICAL SUPPLY — 67 items
BIT DRILL NEURO 2X3.1 SFT TUCH (MISCELLANEOUS) ×1 IMPLANT
BLADE CLIPPER SURG (BLADE) IMPLANT
BNDG GAUZE ELAST 4 BULKY (GAUZE/BANDAGES/DRESSINGS) IMPLANT
BONE CERV LORDOTIC 14.5X12X10 (Bone Implant) ×3 IMPLANT
BUR DRUM 4.0 (BURR) IMPLANT
BUR DRUM 4.0MM (BURR)
CANISTER SUCT 3000ML PPV (MISCELLANEOUS) ×3 IMPLANT
DECANTER SPIKE VIAL GLASS SM (MISCELLANEOUS) ×3 IMPLANT
DRAPE LAPAROTOMY 100X72 PEDS (DRAPES) ×3 IMPLANT
DRAPE MICROSCOPE LEICA (MISCELLANEOUS) ×3 IMPLANT
DRAPE POUCH INSTRU U-SHP 10X18 (DRAPES) ×3 IMPLANT
DRAPE PROXIMA HALF (DRAPES) IMPLANT
DRILL NEURO 2X3.1 SOFT TOUCH (MISCELLANEOUS) ×3
DURAPREP 6ML APPLICATOR 50/CS (WOUND CARE) ×3 IMPLANT
ELECT COATED BLADE 2.86 ST (ELECTRODE) ×3 IMPLANT
ELECT REM PT RETURN 9FT ADLT (ELECTROSURGICAL) ×3
ELECTRODE REM PT RTRN 9FT ADLT (ELECTROSURGICAL) ×1 IMPLANT
GAUZE SPONGE 4X4 16PLY XRAY LF (GAUZE/BANDAGES/DRESSINGS) IMPLANT
GLOVE BIO SURGEON STRL SZ 6.5 (GLOVE) IMPLANT
GLOVE BIO SURGEON STRL SZ7 (GLOVE) IMPLANT
GLOVE BIO SURGEON STRL SZ7.5 (GLOVE) IMPLANT
GLOVE BIO SURGEON STRL SZ8 (GLOVE) IMPLANT
GLOVE BIO SURGEON STRL SZ8.5 (GLOVE) IMPLANT
GLOVE BIO SURGEONS STRL SZ 6.5 (GLOVE)
GLOVE BIOGEL M 8.0 STRL (GLOVE) IMPLANT
GLOVE ECLIPSE 6.5 STRL STRAW (GLOVE) ×3 IMPLANT
GLOVE ECLIPSE 7.0 STRL STRAW (GLOVE) IMPLANT
GLOVE ECLIPSE 7.5 STRL STRAW (GLOVE) IMPLANT
GLOVE ECLIPSE 8.0 STRL XLNG CF (GLOVE) IMPLANT
GLOVE ECLIPSE 8.5 STRL (GLOVE) IMPLANT
GLOVE EXAM NITRILE LRG STRL (GLOVE) IMPLANT
GLOVE EXAM NITRILE MD LF STRL (GLOVE) IMPLANT
GLOVE EXAM NITRILE XL STR (GLOVE) IMPLANT
GLOVE EXAM NITRILE XS STR PU (GLOVE) IMPLANT
GLOVE INDICATOR 6.5 STRL GRN (GLOVE) IMPLANT
GLOVE INDICATOR 7.0 STRL GRN (GLOVE) IMPLANT
GLOVE INDICATOR 7.5 STRL GRN (GLOVE) IMPLANT
GLOVE INDICATOR 8.0 STRL GRN (GLOVE) IMPLANT
GLOVE INDICATOR 8.5 STRL (GLOVE) IMPLANT
GLOVE OPTIFIT SS 8.0 STRL (GLOVE) IMPLANT
GLOVE SURG SS PI 6.5 STRL IVOR (GLOVE) IMPLANT
GOWN STRL REUS W/ TWL LRG LVL3 (GOWN DISPOSABLE) ×2 IMPLANT
GOWN STRL REUS W/ TWL XL LVL3 (GOWN DISPOSABLE) IMPLANT
GOWN STRL REUS W/TWL 2XL LVL3 (GOWN DISPOSABLE) IMPLANT
GOWN STRL REUS W/TWL LRG LVL3 (GOWN DISPOSABLE) ×4
GOWN STRL REUS W/TWL XL LVL3 (GOWN DISPOSABLE)
KIT BASIN OR (CUSTOM PROCEDURE TRAY) ×3 IMPLANT
KIT ROOM TURNOVER OR (KITS) ×3 IMPLANT
LIQUID BAND (GAUZE/BANDAGES/DRESSINGS) ×3 IMPLANT
NEEDLE HYPO 25X1 1.5 SAFETY (NEEDLE) ×3 IMPLANT
NEEDLE SPNL 22GX3.5 QUINCKE BK (NEEDLE) ×3 IMPLANT
NS IRRIG 1000ML POUR BTL (IV SOLUTION) ×3 IMPLANT
PACK LAMINECTOMY NEURO (CUSTOM PROCEDURE TRAY) ×3 IMPLANT
PAD ARMBOARD 7.5X6 YLW CONV (MISCELLANEOUS) ×9 IMPLANT
PIN DISTRACTION 14MM (PIN) ×6 IMPLANT
PLATE HELIX R 28MM 1LEVEL (Plate) ×3 IMPLANT
RUBBERBAND STERILE (MISCELLANEOUS) ×6 IMPLANT
SCREW HELIX R (Screw) ×9 IMPLANT
SCREW SELF DRILL FIX 4.0X15MM (Screw) ×3 IMPLANT
SPONGE INTESTINAL PEANUT (DISPOSABLE) ×3 IMPLANT
SPONGE SURGIFOAM ABS GEL SZ50 (HEMOSTASIS) ×3 IMPLANT
SUT VIC AB 0 CT1 27 (SUTURE) ×2
SUT VIC AB 0 CT1 27XBRD ANTBC (SUTURE) ×1 IMPLANT
SUT VIC AB 3-0 SH 8-18 (SUTURE) ×3 IMPLANT
TOWEL OR 17X24 6PK STRL BLUE (TOWEL DISPOSABLE) ×3 IMPLANT
TOWEL OR 17X26 10 PK STRL BLUE (TOWEL DISPOSABLE) ×3 IMPLANT
WATER STERILE IRR 1000ML POUR (IV SOLUTION) ×3 IMPLANT

## 2016-02-21 NOTE — Anesthesia Procedure Notes (Signed)
Procedure Name: Intubation Date/Time: 02/21/2016 8:06 AM Performed by: Coralee Rud Pre-anesthesia Checklist: Patient identified, Emergency Drugs available, Suction available, Patient being monitored and Timeout performed Patient Re-evaluated:Patient Re-evaluated prior to inductionOxygen Delivery Method: Circle system utilized Preoxygenation: Pre-oxygenation with 100% oxygen Intubation Type: IV induction Ventilation: Two handed mask ventilation required and Oral airway inserted - appropriate to patient size Laryngoscope Size: Glidescope and 3 (Elective Glidescope intubation 2nd myelopathy, beard and very thick neck.) Grade View: Grade I Tube type: Oral Tube size: 8.0 mm Number of attempts: 1 Airway Equipment and Method: Stylet Placement Confirmation: ETT inserted through vocal cords under direct vision,  positive ETCO2 and breath sounds checked- equal and bilateral Secured at: 22 cm Tube secured with: Tape Dental Injury: Teeth and Oropharynx as per pre-operative assessment

## 2016-02-21 NOTE — H&P (Signed)
Victor Collins is an 55 y.o. male.   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   Past Medical History  Diagnosis Date  . Diabetes mellitus without complication (HCC)   . Hypertension   . Neuropathy (HCC)   . Thrombocytopenia (HCC)   . Depression   . Anxiety   . Neuromuscular disorder (HCC)     peripheral neuropathy  . Thrombocytopenia (HCC)   . Splenomegaly   . Hyperlipidemia associated with type 2 diabetes mellitus (HCC)   . Sleep apnea     uses CPAP    Past Surgical History  Procedure Laterality Date  . Anterior cruciate ligament repair      No family history on file. Social History:  reports that he has never smoked. He does not have any smokeless tobacco history on file. He reports that he drinks alcohol. He reports that he does not use illicit drugs.  Allergies: No Known Allergies  Medications Prior to Admission  Medication Sig Dispense Refill  . cholecalciferol (VITAMIN D) 1000 units tablet Take 1,000 Units by mouth daily.    Marland Kitchen ezetimibe-simvastatin (VYTORIN) 10-40 MG per tablet Take 1 tablet by mouth daily. 30 tablet 0  . insulin aspart (NOVOLOG) 100 UNIT/ML injection Inject 60-90 Units into the skin 3 (three) times daily. Sliding scale    . insulin detemir (LEVEMIR) 100 UNIT/ML injection Inject 60 Units into the skin 2 (two) times daily.    . insulin glargine (LANTUS) 100 UNIT/ML injection Inject 30 Units into the skin at bedtime.    . insulin lispro (HUMALOG) 100 UNIT/ML injection Inject 10-120 Units into the skin 3 (three) times daily.     . metFORMIN (GLUCOPHAGE) 500 MG tablet Take 1,000 mg by mouth 2 (two) times daily with a meal.    . methocarbamol (ROBAXIN) 500 MG tablet Take 2 tablets (1,000 mg total) by mouth 4 (four) times daily as needed for muscle spasms (muscle spasm/pain). 25 tablet 0  . Multiple Vitamins-Minerals (MULTIVITAMIN) LIQD Take 15 mLs by mouth daily.    Marland Kitchen oxyCODONE-acetaminophen (PERCOCET/ROXICET) 5-325 MG  tablet Take 2 tablets by mouth every 6 (six) hours as needed for moderate pain or severe pain.    . valsartan-hydrochlorothiazide (DIOVAN HCT) 160-12.5 MG per tablet Take 1 tablet by mouth daily. 30 tablet 0  . vitamin E 400 UNIT capsule Take 400 Units by mouth daily.    Marland Kitchen ALPRAZolam (XANAX) 0.5 MG tablet Take 0.5 mg by mouth 2 (two) times daily as needed for anxiety.    Marland Kitchen HYDROcodone-acetaminophen (NORCO/VICODIN) 5-325 MG tablet 1 or 2 tabs PO q6 hours prn pain 20 tablet 0  . pregabalin (LYRICA) 300 MG capsule Take 300 mg by mouth at bedtime.      Results for orders placed or performed during the hospital encounter of 02/21/16 (from the past 48 hour(s))  Glucose, capillary     Status: Abnormal   Collection Time: 02/21/16  6:42 AM  Result Value Ref Range   Glucose-Capillary 111 (H) 65 - 99 mg/dL  Prepare Pheresed Platelets     Status: None (Preliminary result)   Collection Time: 02/21/16  7:14 AM  Result Value Ref Range   Unit Number [id number]    Blood Component Type PLTP LR1 PAS    Unit division 00    Status of Unit ISSUED    Transfusion Status OK TO TRANSFUSE    No results found.  Review of Systems  Eyes: Negative.   Respiratory: Negative.   Cardiovascular: Negative.  Gastrointestinal: Negative.   Genitourinary: Negative.   Musculoskeletal: Positive for neck pain.  Skin: Negative.   Neurological: Positive for weakness.  Endo/Heme/Allergies: Bruises/bleeds easily.  Psychiatric/Behavioral: Negative.     Blood pressure 123/72, pulse 85, temperature 97.9 F (36.6 C), temperature source Oral, resp. rate 20, height 6' 1.5" (1.867 m), weight 136.079 kg (300 lb), SpO2 97 %. Physical Exam  Constitutional: He is oriented to person, place, and time. He appears well-developed and well-nourished. No distress.  HENT:  Head: Normocephalic and atraumatic.  Eyes: Conjunctivae and EOM are normal. Pupils are equal, round, and reactive to light.  Neck: Neck supple.  Cardiovascular:  Normal rate and regular rhythm.   Respiratory: Effort normal and breath sounds normal.  GI: Soft. Bowel sounds are normal.  Neurological: He is alert and oriented to person, place, and time. He is not disoriented. No cranial nerve deficit. Coordination and gait abnormal. GCS eye subscore is 4. GCS verbal subscore is 5. GCS motor subscore is 6. He displays no Babinski's sign on the right side. He displays no Babinski's sign on the left side.  Weakness in upper extremities, hyperreflexia, difficulty with fine motor movement and coordination Spastic gait  Skin: Skin is warm and dry.     Assessment/Plan Or for decompression.   Lucious Zou L, MD 02/21/2016, 7:53 AM

## 2016-02-21 NOTE — Anesthesia Postprocedure Evaluation (Signed)
Anesthesia Post Note  Patient: Victor Collins  Procedure(s) Performed: Procedure(s) (LRB): ANTERIOR CERVICAL DECOMPRESSION/DISCECTOMY FUSION 1 LEVEL (N/A)  Patient location during evaluation: PACU Anesthesia Type: General Level of consciousness: awake, awake and alert and oriented Pain management: pain level controlled Vital Signs Assessment: post-procedure vital signs reviewed and stable Respiratory status: spontaneous breathing and nonlabored ventilation Postop Assessment: no headache Anesthetic complications: no    Last Vitals:  Filed Vitals:   02/21/16 1436 02/21/16 1555  BP:  144/71  Pulse: 94 92  Temp: 36.1 C 37 C  Resp: 24 22    Last Pain:  Filed Vitals:   02/21/16 1557  PainSc: 3                  Jailyne Chieffo COKER

## 2016-02-21 NOTE — Op Note (Signed)
02/21/2016  11:22 AM  PATIENT:  Victor Collins  55 y.o. male whom presented with cervical myelopathy after trauma.   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 was taken to the operating room, intubated, and placed under general anesthesia without difficulty. He was positioned supine with his head in slight extension on a horseshoe headrest. The neck was prepped and draped in a sterile manner. I infiltrated 8 cc's 1/2%lidocaine/1:200,000 strength epinephrine into the planned incision starting from the midline to the medial border of the left sternocleidomastoid muscle. I opened the incision with a 10 blade and dissected sharply through soft tissue to the platysma. I dissected in the plane superior to the platysma both rostrally and caudally. I then opened the platysma in a horizontal fashion with Metzenbaum scissors, and dissected in the inferior plane rostrally and caudally. With both blunt and sharp technique I created an avascular corridor to the cervical spine. I placed a spinal needle(s) in the disc space at 4/5,5/6, and 6/7 . I then reflected the longus colli from C6 to C7 and placed self retaining retractors. I opened the disc space(s) at 6/7 with a 15 blade. I removed disc with curettes, Kerrison punches, and the drill. Using the drill I removed osteophytes and prepared for the decompression.  I  decompressed the spinal canal and the C7 root(s) with the drill, Kerrison punches, and the curettes. I used the microscope to aid in microdissection. I removed the posterior longitudinal ligament to fully expose and decompress the thecal sac. I exposed the roots laterally taking down the 6/7 uncovertebral joints. With the decompression complete we moved on to the arthrodesis. We used the drill to level the surfaces of C6/7. I removed soft tissue to prepare the disc space and the bony surfaces. I measured the space and placed a 10mm structural allograft into the disc space.  We then placed the anterior instrumentation. I placed 2 screws in each vertebral body through the plate. I locked the screws into place. Intraoperative xray showed the graft, plate, and screws to be in good position. I irrigated the wound, achieved hemostasis, and closed the wound in layers. I approximated the platysma, and the subcuticular plane with vicryl sutures. I used Dermabond for a sterile dressing.   PLAN OF CARE: Admit to inpatient   PATIENT DISPOSITION:  PACU - hemodynamically stable.   Delay start of Pharmacological VTE agent (>24hrs) due to surgical blood loss or risk of bleeding:  yes

## 2016-02-21 NOTE — Transfer of Care (Signed)
Immediate Anesthesia Transfer of Care Note  Patient: Victor Collins  Procedure(s) Performed: Procedure(s) with comments: ANTERIOR CERVICAL DECOMPRESSION/DISCECTOMY FUSION 1 LEVEL (N/A) - C67 anterior cervical decompression with fusion plating and bonegraft  Patient Location: PACU  Anesthesia Type:General  Level of Consciousness: awake, alert , oriented and patient cooperative  Airway & Oxygen Therapy: Patient Spontanous Breathing and Patient connected to face mask oxygen  Post-op Assessment: Report given to RN, Post -op Vital signs reviewed and stable, Patient moving all extremities, Patient moving all extremities X 4 and Patient able to stick tongue midline  Post vital signs: Reviewed and stable  Last Vitals:  Filed Vitals:   02/21/16 0655  BP: 123/72  Pulse: 85  Temp: 36.6 C  Resp: 20    Complications: No apparent anesthesia complications

## 2016-02-21 NOTE — Anesthesia Preprocedure Evaluation (Addendum)
Anesthesia Evaluation  Patient identified by MRN, date of birth, ID band Patient awake    Reviewed: Allergy & Precautions, NPO status , Patient's Chart, lab work & pertinent test results  History of Anesthesia Complications Negative for: history of anesthetic complications  Airway Mallampati: II  TM Distance: <3 FB Neck ROM: Limited   Comment: Short very thick neck.  Full beard and moustache. Dental  (+) Teeth Intact   Pulmonary sleep apnea and Continuous Positive Airway Pressure Ventilation ,    Pulmonary exam normal        Cardiovascular hypertension, Pt. on medications  Rhythm:Regular     Neuro/Psych Anxiety    GI/Hepatic negative GI ROS, Neg liver ROS,   Endo/Other  diabetes, Well Controlled, Type 2  Renal/GU negative Renal ROS     Musculoskeletal   Abdominal (+) + obese,  Abdomen: soft.    Peds  Hematology Spleenomegaly, thrombocytopenia, platelets 92k   Anesthesia Other Findings   Reproductive/Obstetrics                            Anesthesia Physical Anesthesia Plan  ASA: III  Anesthesia Plan: General   Post-op Pain Management:    Induction: Intravenous  Airway Management Planned: Oral ETT and Video Laryngoscope Planned  Additional Equipment:   Intra-op Plan:   Post-operative Plan: Extubation in OR  Informed Consent:   Dental advisory given  Plan Discussed with: CRNA, Anesthesiologist and Surgeon  Anesthesia Plan Comments: (Administer platelets immediately post induction.)        Anesthesia Quick Evaluation

## 2016-02-22 DIAGNOSIS — M4712 Other spondylosis with myelopathy, cervical region: Secondary | ICD-10-CM | POA: Diagnosis not present

## 2016-02-22 LAB — GLUCOSE, CAPILLARY
GLUCOSE-CAPILLARY: 110 mg/dL — AB (ref 65–99)
Glucose-Capillary: 148 mg/dL — ABNORMAL HIGH (ref 65–99)
Glucose-Capillary: 177 mg/dL — ABNORMAL HIGH (ref 65–99)
Glucose-Capillary: 140 mg/dL — ABNORMAL HIGH (ref 65–99)

## 2016-02-22 LAB — PREPARE PLATELET PHERESIS: Unit division: 0
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
## 2016-02-22 NOTE — Progress Notes (Signed)
Patient ID: Victor Collins, male   DOB: Jan 02, 1961, 55 y.o.   MRN: [id number] BP 106/51 mmHg  Pulse 92  Temp(Src) 98.5 F (36.9 C) (Oral)  Resp 18  Ht 6' 1.5" (1.867 m)  Wt 128.958 kg (284 lb 4.8 oz)  BMI 37.00 kg/m2  SpO2 95% Alert and oriented x 4, speaking voice is strong Moving all extremities Mild weakness bilateral grips, intrinsics Wound is clean, dry, no signs of infection  Doing well overall. Will need therapies pt, ot

## 2016-02-23 DIAGNOSIS — M4712 Other spondylosis with myelopathy, cervical region: Secondary | ICD-10-CM | POA: Diagnosis not present

## 2016-02-23 LAB — GLUCOSE, CAPILLARY
GLUCOSE-CAPILLARY: 107 mg/dL — AB (ref 65–99)
Glucose-Capillary: 91 mg/dL (ref 65–99)
Glucose-Capillary: 129 mg/dL — ABNORMAL HIGH (ref 65–99)
Glucose-Capillary: 152 mg/dL — ABNORMAL HIGH (ref 65–99)

## 2016-02-23 MED ORDER — POLYETHYLENE GLYCOL 3350 17 G PO PACK
17.0000 g | PACK | Freq: Every day | ORAL | Status: DC
Start: 2016-02-23 — End: 2016-02-27
  Administered 2016-02-23 – 2016-02-27 (×5): 17 g via ORAL
  Filled 2016-02-23 (×4): qty 1
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
## 2016-02-23 NOTE — Progress Notes (Signed)
Humidifier filled, patient to self administer CPAP when ready for bed.  Patient is familiar with equipment and procedure.

## 2016-02-23 NOTE — Progress Notes (Signed)
No acute events AVSS Moving extremities well Incision c/d/i Stable SNF placement
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
## 2016-02-24 ENCOUNTER — Encounter (HOSPITAL_COMMUNITY): Payer: Self-pay | Admitting: Neurosurgery

## 2016-02-24 DIAGNOSIS — M4712 Other spondylosis with myelopathy, cervical region: Secondary | ICD-10-CM | POA: Diagnosis not present

## 2016-02-24 LAB — GLUCOSE, CAPILLARY
Glucose-Capillary: 115 mg/dL — ABNORMAL HIGH (ref 65–99)
Glucose-Capillary: 117 mg/dL — ABNORMAL HIGH (ref 65–99)
Glucose-Capillary: 131 mg/dL — ABNORMAL HIGH (ref 65–99)
Glucose-Capillary: 100 mg/dL — ABNORMAL HIGH (ref 65–99)

## 2016-02-24 NOTE — Progress Notes (Signed)
Patient ID: Victor Collins, male   DOB: 1961-07-17, 55 y.o.   MRN: [id number] BP 129/67 mmHg  Pulse 82  Temp(Src) 98.4 F (36.9 C) (Oral)  Resp 18  Ht 6' 1.5" (1.867 m)  Wt 128.958 kg (284 lb 4.8 oz)  BMI 37.00 kg/m2  SpO2 99% Alert and oriented x 4, speech is clear, FLUENT Moving all extremities well Right grip 4/5 Complaining of severe pain in the right upper extremity will order ct for tomorrow of cspine Wound is clean and dry.
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
## 2016-02-25 ENCOUNTER — Ambulatory Visit (HOSPITAL_COMMUNITY): Payer: Commercial Managed Care - PPO

## 2016-02-25 ENCOUNTER — Encounter (HOSPITAL_COMMUNITY): Payer: Self-pay | Admitting: Radiology

## 2016-02-25 DIAGNOSIS — M4712 Other spondylosis with myelopathy, cervical region: Secondary | ICD-10-CM | POA: Diagnosis not present

## 2016-02-25 LAB — GLUCOSE, CAPILLARY
GLUCOSE-CAPILLARY: 130 mg/dL — AB (ref 65–99)
GLUCOSE-CAPILLARY: 181 mg/dL — AB (ref 65–99)
Glucose-Capillary: 84 mg/dL (ref 65–99)
Glucose-Capillary: 143 mg/dL — ABNORMAL HIGH (ref 65–99)

## 2016-02-25 NOTE — Clinical Social Work Placement (Signed)
   CLINICAL SOCIAL WORK PLACEMENT  NOTE  Date:  02/25/2016  Patient Details  Name: Victor Collins MRN: [id number] Date of Birth: 10/29/1961  Clinical Social Work is seeking post-discharge placement for this patient at the Skilled  Nursing Facility level of care (*CSW will initial, date and re-position this form in  chart as items are completed):  Yes   Patient/family provided with [facility] Clinical Social Work Department's list of facilities offering this level of care within the geographic area requested by the patient (or if unable, by the patient's family).  Yes   Patient/family informed of their freedom to choose among providers that offer the needed level of care, that participate in Medicare, Medicaid or managed care program needed by the patient, have an available bed and are willing to accept the patient.  Yes   Patient/family informed of [facility]'s ownership interest in [facility] and [facility], as well as of the fact that they are under no obligation to receive care at these facilities.  PASRR submitted to EDS on       PASRR number received on       Existing PASRR number confirmed on       FL2 transmitted to all facilities in geographic area requested by pt/family on 02/25/16     FL2 transmitted to all facilities within larger geographic area on       Patient informed that his/her managed care company has contracts with or will negotiate with certain facilities, including the following:            Patient/family informed of bed offers received.  Patient chooses bed at       Physician recommends and patient chooses bed at      Patient to be transferred to   on  .  Patient to be transferred to facility by       Patient family notified on   of transfer.  Name of family member notified:        PHYSICIAN Please sign FL2     Additional Comment:    _______________________________________________ Orson Gear, Student-SW 02/25/2016, 9:25 AM

## 2016-02-25 NOTE — Clinical Social Work Note (Signed)
BSW intern has presented pt with extended bed offers. Patient is a new resident of [facility] and would like to consult with a few friends concerning facility placement, before making final decision. CSW has been made aware to f/u with patient in reference to post-discharge planning.   BSW intern continues to follow patient if any Social Work needs arises.  Orson Gear- BSW intern [phone]

## 2016-02-25 NOTE — Care Management Note (Signed)
Case Management Note  Patient Details  Name: Victor Collins MRN: [id number] Date of Birth: 12-03-1961  Subjective/Objective:    Patient admitted and underwent cervical surgery. Patient is from home alone.                Action/Plan: Recommendation is for SNF. CM following for discharge disposition.  Expected Discharge Date:   (Pending)               Expected Discharge Plan:     In-House Referral:     Discharge planning Services     Post Acute Care Choice:    Choice offered to:     DME Arranged:    DME Agency:     HH Arranged:    HH Agency:     Status of Service:  In process, will continue to follow  Medicare Important Message Given:    Date Medicare IM Given:    Medicare IM give by:    Date Additional Medicare IM Given:    Additional Medicare Important Message give by:     If discussed at Long Length of Stay Meetings, dates discussed:    Additional Comments:  Kermit Balo, RN 02/25/2016, 10:31 AM
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
## 2016-02-25 NOTE — [place] FL2 (Signed)
MEDICAID FL2 LEVEL OF CARE SCREENING TOOL     IDENTIFICATION  Patient Name: Victor Collins Birthdate: October 06, 1961 Sex: male Admission Date (Current Location): 02/21/2016  [place] and [place] Number:  [facility]nes Bast  [facility] HEALTHCARE/UM[facility]/[employer] [place]- [id number]) Facility and Address:  [facility] [place], [address] [place], [place] [address]      Provider Number: [id number]  Attending Physician Name and Address:  Coletta Memos, MD  Relative Name and Phone Number:   Burnett Corrente, [phone])    Current Level of Care: Hospital Recommended Level of Care: Skilled Nursing Facility Prior Approval Number:    Date Approved/Denied:   PASRR Number:    Discharge Plan: SNF    Current Diagnoses: Patient Active Problem List   Diagnosis Date Noted  . Stenosis of cervical spine with myelopathy 02/21/2016    Orientation RESPIRATION BLADDER Height & Weight     Self, Time, Situation, Place  O2 (CPAP) Continent Weight: 284 lb 4.8 oz (128.958 kg) Height:  6' 1.5" (186.7 cm)  BEHAVIORAL SYMPTOMS/MOOD NEUROLOGICAL BOWEL NUTRITION STATUS      Continent Diet (CARB MODIFIED)  AMBULATORY STATUS COMMUNICATION OF NEEDS Skin   Limited Assist   Surgical wounds (incision location: neck; left)                       Personal Care Assistance Level of Assistance  Total care       Total Care Assistance: Limited assistance   Functional Limitations Info             SPECIAL CARE FACTORS FREQUENCY  OT (By licensed OT), PT (By licensed PT)     PT Frequency:  (5x/week) OT Frequency:  (2x/week)            Contractures      Additional Factors Info  Code Status, Allergies Code Status Info:  (NOT ON FILE) Allergies Info:  (NO KNOWN ALLERGIES)           Current Medications (02/25/2016):  This is the current hospital active medication list Current Facility-Administered Medications  Medication Dose Route Frequency Provider Last Rate Last Dose  .  0.9 %  sodium chloride infusion  250 mL Intravenous Continuous Coletta Memos, MD   250 mL at 02/21/16 1500  . 0.9 % NaCl with KCl 20 mEq/ L  infusion   Intravenous Continuous Coletta Memos, MD 80 mL/hr at 02/21/16 1718    . acetaminophen (TYLENOL) tablet 650 mg  650 mg Oral Q4H PRN Coletta Memos, MD       Or  . acetaminophen (TYLENOL) suppository 650 mg  650 mg Rectal Q4H PRN Coletta Memos, MD      . ALPRAZolam Prudy Feeler) tablet 0.5 mg  0.5 mg Oral BID PRN Coletta Memos, MD   0.5 mg at 02/24/16 2220  . bisacodyl (DULCOLAX) EC tablet 5 mg  5 mg Oral Daily PRN Coletta Memos, MD      . cholecalciferol (VITAMIN D) tablet 1,000 Units  1,000 Units Oral Daily Coletta Memos, MD   1,000 Units at 02/24/16 1023  . docusate sodium (COLACE) capsule 100 mg  100 mg Oral BID Coletta Memos, MD   100 mg at 02/24/16 2220  . ezetimibe-simvastatin (VYTORIN) 10-40 MG per tablet 1 tablet  1 tablet Oral Daily Coletta Memos, MD   1 tablet at 02/24/16 1022  . irbesartan (AVAPRO) tablet 150 mg  150 mg Oral Daily Coletta Memos, MD   150 mg at 02/24/16 1023  And  . hydrochlorothiazide (MICROZIDE) capsule 12.5 mg  12.5 mg Oral Daily Coletta Memos, MD   12.5 mg at 02/24/16 1023  . HYDROcodone-acetaminophen (NORCO/VICODIN) 5-325 MG per tablet 1-2 tablet  1-2 tablet Oral Q4H PRN Coletta Memos, MD      . insulin aspart (novoLOG) injection 0-20 Units  0-20 Units Subcutaneous TID WC Coletta Memos, MD   3 Units at 02/24/16 1757  . insulin aspart (novoLOG) injection 0-5 Units  0-5 Units Subcutaneous QHS Coletta Memos, MD   3 Units at 02/21/16 2312  . insulin glargine (LANTUS) injection 30 Units  30 Units Subcutaneous QHS Coletta Memos, MD   30 Units at 02/24/16 2222  . magnesium citrate solution 1 Bottle  1 Bottle Oral Once PRN Coletta Memos, MD      . menthol-cetylpyridinium (CEPACOL) lozenge 3 mg  1 lozenge Oral PRN Coletta Memos, MD       Or  . phenol (CHLORASEPTIC) mouth spray 1 spray  1 spray Mouth/Throat PRN Coletta Memos, MD      . metFORMIN  (GLUCOPHAGE) tablet 1,000 mg  1,000 mg Oral BID WC Coletta Memos, MD   1,000 mg at 02/25/16 0804  . methocarbamol (ROBAXIN) tablet 1,000 mg  1,000 mg Oral QID PRN Coletta Memos, MD   1,000 mg at 02/25/16 0534  . morphine 2 MG/ML injection 1-4 mg  1-4 mg Intravenous Q3H PRN Coletta Memos, MD   2 mg at 02/22/16 2324  . ondansetron (ZOFRAN) injection 4 mg  4 mg Intravenous Q4H PRN Coletta Memos, MD      . oxyCODONE-acetaminophen (PERCOCET/ROXICET) 5-325 MG per tablet 1-2 tablet  1-2 tablet Oral Q4H PRN Coletta Memos, MD   2 tablet at 02/25/16 0534  . polyethylene glycol (MIRALAX / GLYCOLAX) packet 17 g  17 g Oral Daily Coletta Memos, MD   17 g at 02/24/16 1022  . senna-docusate (Senokot-S) tablet 1 tablet  1 tablet Oral QHS PRN Coletta Memos, MD      . sodium chloride flush (NS) 0.9 % injection 3 mL  3 mL Intravenous Q12H Coletta Memos, MD   3 mL at 02/24/16 2230  . sodium chloride flush (NS) 0.9 % injection 3 mL  3 mL Intravenous PRN Coletta Memos, MD      . zolpidem (AMBIEN) tablet 5 mg  5 mg Oral QHS PRN,MR X 1 Coletta Memos, MD   5 mg at 02/21/16 2243     Discharge Medications: Please see discharge summary for a list of discharge medications.  Relevant Imaging Results:  Relevant Lab Results:   Additional Information  (SSN: [id number])  Orson Gear, Student-SW [phone]

## 2016-02-25 NOTE — Clinical Social Work Note (Signed)
Clinical Social Work Assessment  Patient Details  Name: Victor Collins MRN: [id number] Date of Birth: 1961-03-12  Date of referral:  02/25/16               Reason for consult:  Facility Placement, Discharge Planning                Permission sought to share information with:  Facility Industrial/product designer granted to share information::  No  Name::      (n/a)  Agency::   (SNF)  Relationship::   (n/a)  Contact Information:   (n/a)  Housing/Transportation Living arrangements for the past 2 months:  Single Family Home Source of Information:  Patient Patient Interpreter Needed:  None Criminal Activity/Legal Involvement Pertinent to Current Situation/Hospitalization:  No - Comment as needed Significant Relationships:  Friend Lives with:  Self Do you feel safe going back to the place where you live?  No Need for family participation in patient care:  Yes (Comment)  Care giving concerns:   Patient appeared to be extremely worried of returning home alone, once rehab has been completed. Pt states that he has one flight of stairs to take in order to enter his home. However, there's no one home during the day who could provide supervision or assist him up the stairs.   Social Worker assessment / plan:   Pt a/o x4. BSW intern has spoken with patient at bedside to discuss d/c disposition. Patient appeared a little lethargic during BSW intern assessment however, was able to carry steady conversation. BSW intern informed pt of SNF recommendation given by PT. BSW intern further presented patient with SNF list as well as discuss SNF process and insurance coverage. Patient lives in [place] and has given BSW intern permission to fax clinical to SNF within [facility] BSW intern to f/u with patient in reference to extended bed offers once clinical have been reviewed by facility. At this time patient's d/c is unknown. BSW intern remains available.  Employment status:   Unemployed Health and safety inspector:  Medicare PT Recommendations:  Skilled Nursing Facility Information / Referral to community resources:  Skilled Nursing Facility  Patient/Family's Response to care:   Patient appreciated Social Work intervention from [employer]. Patient seemed hopeful in returning home independently with possible equipment from home health.  Patient/Family's Understanding of and Emotional Response to Diagnosis, Current Treatment, and Prognosis:   Patient understood need for further medical care and rehab at SNF.  Emotional Assessment Appearance:  Appears stated age Attitude/Demeanor/Rapport:  Lethargic Affect (typically observed):  Accepting, Appropriate, Calm, Pleasant Orientation:  Oriented to Self, Oriented to Place, Oriented to  Time, Oriented to Situation Alcohol / Substance use:  Not Applicable Psych involvement (Current and /or in the community):  No (Comment)  Discharge Needs  Concerns to be addressed:  Home Safety Concerns Readmission within the last 30 days:  No Current discharge risk:  None Barriers to Discharge:  No Barriers Identified   Orson Gear, Student-SW 02/25/2016, 9:43 AM

## 2016-02-25 NOTE — Progress Notes (Signed)
BP 150/67 mmHg  Pulse 87  Temp(Src) 98.7 F (37.1 C) (Oral)  Resp 20  Ht 6' 1.5" (1.867 m)  Wt 128.958 kg (284 lb 4.8 oz)  BMI 37.00 kg/m2  SpO2 97% Alert and oriented x 4 Moving all extremities well Says the pain in the right upper extremity is improved.  Ct reviewed, still with narrowing in the right 6/7 foramen. Will wait to see if the right upper extremity continues to improve.

## 2016-02-26 DIAGNOSIS — M4712 Other spondylosis with myelopathy, cervical region: Secondary | ICD-10-CM | POA: Diagnosis not present

## 2016-02-26 LAB — GLUCOSE, CAPILLARY
GLUCOSE-CAPILLARY: 130 mg/dL — AB (ref 65–99)
GLUCOSE-CAPILLARY: 158 mg/dL — AB (ref 65–99)
Glucose-Capillary: 125 mg/dL — ABNORMAL HIGH (ref 65–99)
Glucose-Capillary: 115 mg/dL — ABNORMAL HIGH (ref 65–99)

## 2016-02-26 NOTE — Progress Notes (Signed)
Patient ID: Victor Collins, male   DOB: 07-May-1961, 55 y.o.   MRN: [id number] BP 121/67 mmHg  Pulse 88  Temp(Src) 98.4 F (36.9 C) (Oral)  Resp 20  Ht 6' 1.5" (1.867 m)  Wt 128.958 kg (284 lb 4.8 oz)  BMI 37.00 kg/m2  SpO2 98% Alert and oriented x 4 Moving all extremities well Less pain in right upper extremity For discharge tomorrow.
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
## 2016-02-26 NOTE — Clinical Social Work Note (Signed)
Patient has a bed available at [facility] for short term rehab tomorrow.  CSW to facilitate discharge to SNF once he is medically ready for discharge and orders have been received.  Ervin Knack. Vittoria Noreen, MSW, Theresia Majors [phone] 02/26/2016 4:49 PM
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
## 2016-02-26 NOTE — Clinical Social Work Note (Signed)
CSW spoke to patient and presented bed offers.  Patient chose to go to [facility] for short term rehab.  CSW to continue to follow patient's progress.  Ervin Knack. Charnele Semple, MSW, LCSWA [phone] 02/26/2016 1:12 PM

## 2016-02-26 NOTE — Progress Notes (Signed)
Pt reported feeling anxious and unsettled. PRN Xanax give but patient still feeling anxious and request to speak with someone.  Chaplain notified and came up and spoke with patient. After Chaplain's visit, patient reports feeling calmer and expressed appreciation for Chaplain. Pt now resting comfortably. RN will continue to monitor.

## 2016-02-26 NOTE — Progress Notes (Signed)
CM left a message for the RN in surgery to notify Dr Franky Macho that patient has a SNF bed available at [facility] when he is ready for discharge.  Per surgery RN, Dr Franky Macho may not be available until after 5pm, as that is the estimated end time for his surgical case.  CM will update CSW and remain available for discharge planning needs.  Elmer Bales RN, MSN [phone]

## 2016-02-27 DIAGNOSIS — M4712 Other spondylosis with myelopathy, cervical region: Secondary | ICD-10-CM | POA: Diagnosis not present

## 2016-02-27 LAB — GLUCOSE, CAPILLARY
GLUCOSE-CAPILLARY: 89 mg/dL (ref 65–99)
Glucose-Capillary: 151 mg/dL — ABNORMAL HIGH (ref 65–99)

## 2016-02-27 MED ORDER — ALPRAZOLAM 0.25 MG PO TABS: 0.2500 mg | ORAL_TABLET | Freq: Three times a day (TID) | ORAL | Status: DC | PRN

## 2016-02-27 MED ORDER — OXYCODONE-ACETAMINOPHEN 5-325 MG PO TABS: 2.0000 | ORAL_TABLET | Freq: Four times a day (QID) | ORAL | Status: DC | PRN

## 2016-02-27 NOTE — Discharge Summary (Signed)
Physician Discharge Summary  Patient ID: Victor Collins MRN: [id number] DOB/AGE: 06-17-1961 55 y.o.  Admit date: 02/21/2016 Discharge date: 02/27/2016  Admission Diagnoses:Cervical myelopathy with HNP C6/7  Discharge Diagnoses: same Active Problems:   Stenosis of cervical spine with myelopathy   Discharged Condition: good  Hospital Course: Mr. Spadafore was admitted and taken to the operating room for an ACDF at C6/7 due to cervical cord compression causing cervical myelopathy. He had an uncomplicated procedure. He was transfused platelets secondary to thrombocytopenia per his hematologist's recommendation. Post op he has complained of severe pain in the right shoulder which prompted me to obtain a CT of the cervical spine. The CT was unremarkable, he still has some narrowing but is certainly better than he was. The right shoulder pain has improved at the time of discharge. He continues to work with PT and OT.  Treatments: surgery: Anterior Cervical decompression C6/7 Arthrodesis C6/7 with 10mm structural allograft Anterior instrumentation(Nuvasive Helix) C6/7  Discharge Exam: Blood pressure 137/61, pulse 86, temperature 98.2 F (36.8 C), temperature source Oral, resp. rate 18, height 6' 1.5" (1.867 m), weight 128.958 kg (284 lb 4.8 oz), SpO2 98 %. General appearance: alert, cooperative, appears stated age and no distress Neurologic: Mental status: Alert, oriented, thought content appropriate Cranial nerves: normal Motor: weakness in the upper extremities, grips worse than proximal musculature strength.   Disposition: 01-Home or Self Care osteoarthritis of cervical spine with myelopathy    Medication List    STOP taking these medications        HYDROcodone-acetaminophen 5-325 MG tablet  Commonly known as:  NORCO/VICODIN      TAKE these medications        ALPRAZolam 0.5 MG tablet  Commonly known as:  XANAX  Take 0.5 mg by mouth 2 (two) times daily as needed for anxiety.      ALPRAZolam 0.25 MG tablet  Commonly known as:  XANAX  Take 1 tablet (0.25 mg total) by mouth 3 (three) times daily as needed for anxiety.     cholecalciferol 1000 units tablet  Commonly known as:  VITAMIN D  Take 1,000 Units by mouth daily.     ezetimibe-simvastatin 10-40 MG tablet  Commonly known as:  VYTORIN  Take 1 tablet by mouth daily.     insulin aspart 100 UNIT/ML injection  Commonly known as:  novoLOG  Inject 60-90 Units into the skin 3 (three) times daily. Sliding scale     insulin detemir 100 UNIT/ML injection  Commonly known as:  LEVEMIR  Inject 60 Units into the skin 2 (two) times daily.     insulin glargine 100 UNIT/ML injection  Commonly known as:  LANTUS  Inject 30 Units into the skin at bedtime.     insulin lispro 100 UNIT/ML injection  Commonly known as:  HUMALOG  Inject 10-120 Units into the skin 3 (three) times daily.     metFORMIN 500 MG tablet  Commonly known as:  GLUCOPHAGE  Take 1,000 mg by mouth 2 (two) times daily with a meal.     methocarbamol 500 MG tablet  Commonly known as:  ROBAXIN  Take 2 tablets (1,000 mg total) by mouth 4 (four) times daily as needed for muscle spasms (muscle spasm/pain).     MULTIVITAMIN Liqd  Take 15 mLs by mouth daily.     oxyCODONE-acetaminophen 5-325 MG tablet  Commonly known as:  PERCOCET/ROXICET  Take 2 tablets by mouth every 6 (six) hours as needed for moderate pain or severe pain.  pregabalin 300 MG capsule  Commonly known as:  LYRICA  Take 300 mg by mouth at bedtime.     valsartan-hydrochlorothiazide 160-12.5 MG tablet  Commonly known as:  DIOVAN HCT  Take 1 tablet by mouth daily.     vitamin E 400 UNIT capsule  Take 400 Units by mouth daily.           Follow-up Information    Follow up with Skylor Hughson L, MD In 3 weeks.   Specialty:  Neurosurgery   Why:  Call the office to make an appointment   Contact information:   [address] [place] [address] [phone]        Signed: Carmela Hurt 02/27/2016, 3:58 PM
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
## 2016-02-27 NOTE — Care Management Note (Signed)
Case Management Note  Patient Details  Name: Fortino Haag MRN: [id number] Date of Birth: 1961-04-19  Subjective/Objective:                    Action/Plan: Patient discharging to SNF today. No further needs per CM.   Expected Discharge Date:   (Pending)               Expected Discharge Plan:     In-House Referral:     Discharge planning Services     Post Acute Care Choice:    Choice offered to:     DME Arranged:    DME Agency:     HH Arranged:    HH Agency:     Status of Service:  In process, will continue to follow  Medicare Important Message Given:    Date Medicare IM Given:    Medicare IM give by:    Date Additional Medicare IM Given:    Additional Medicare Important Message give by:     If discussed at Long Length of Stay Meetings, dates discussed:    Additional Comments:  Kermit Balo, RN 02/27/2016, 4:16 PM

## 2016-02-27 NOTE — Clinical Social Work Note (Addendum)
CSW spoke with patient he had expressed that he had some other questions an some anxiety about going to SNF for short term rehab.  CSW provided psychosocial support by listening to patient, reassuring him that people of all ages go to SNFs for short term rehab.  Patient expressed that he is feeling better today and much better then two days ago.  Patient expressed that he is looking forward to going to SNF for rehab so he can build his confidence and strength up to return back home.  Patient stated he has a friend of his who will pick up patient and transport him to SNF.   Patient expressed that he is grateful for all the care that has been provided.  Patient to be d/c'ed today to [facility].  Patient and family agreeable to plans will transport via patient's friend's car.  RN to call report to [phone].  Victor Collins, MSW, Victor Collins [phone]

## 2016-02-27 NOTE — Discharge Instructions (Signed)

## 2016-02-27 NOTE — Progress Notes (Signed)
Patient is scheduled for discharge to [facility] and rehab. Report called in to Tacey Ruiz, LPN.

## 2016-02-27 NOTE — Progress Notes (Signed)
Discharge orders received, Pt for discharge home today IV d/c'd. D/c instructions and RX given with verbalized understanding. Family at bedside to assist patient with discharge. Staff bought pt downstairs via wheelchair. 02/27/16 1812

## 2016-02-27 NOTE — Clinical Social Work Note (Signed)
Patient has bed available for discharge to [facility] today.  [facility] summary has been completed and orders have been received.  Ervin Knack. Kayden Hutmacher, MSW, Theresia Majors [phone] 02/27/2016 2:02 PM

## 2016-02-28 ENCOUNTER — Encounter: Payer: Self-pay | Admitting: Nurse Practitioner

## 2016-02-28 ENCOUNTER — Non-Acute Institutional Stay (SKILLED_NURSING_FACILITY): Payer: Commercial Managed Care - PPO | Admitting: Nurse Practitioner

## 2016-02-28 DIAGNOSIS — M4712 Other spondylosis with myelopathy, cervical region: Secondary | ICD-10-CM | POA: Diagnosis not present

## 2016-02-28 DIAGNOSIS — K5901 Slow transit constipation: Secondary | ICD-10-CM

## 2016-02-28 DIAGNOSIS — M25511 Pain in right shoulder: Secondary | ICD-10-CM

## 2016-02-28 DIAGNOSIS — I10 Essential (primary) hypertension: Secondary | ICD-10-CM | POA: Diagnosis not present

## 2016-02-28 DIAGNOSIS — J301 Allergic rhinitis due to pollen: Secondary | ICD-10-CM

## 2016-02-28 DIAGNOSIS — M4802 Spinal stenosis, cervical region: Secondary | ICD-10-CM

## 2016-02-28 DIAGNOSIS — E1129 Type 2 diabetes mellitus with other diabetic kidney complication: Secondary | ICD-10-CM

## 2016-02-28 DIAGNOSIS — G992 Myelopathy in diseases classified elsewhere: Secondary | ICD-10-CM

## 2016-02-28 DIAGNOSIS — E1142 Type 2 diabetes mellitus with diabetic polyneuropathy: Secondary | ICD-10-CM

## 2016-02-28 NOTE — Progress Notes (Signed)
Patient ID: Victor Collins, male   DOB: 10/23/1961, 55 y.o.   MRN: [id number]    Nursing Home Location:  [facility]   Place of Service: SNF (31)  PCP: No PCP Per Patient  No Known Allergies  Chief Complaint  Patient presents with  . Hospitalization Follow-up    HPI:  Patient is a 55 y.o. male seen today at [facility] to follow up hospitalization and clarify medications.  Pt at [facility] after hospitalization for anterior cervical decompression of C6/7 due to cervical cord compression causing cervical myelopathy. Post op he had complained of severe pain in the right shoulder which prompted me to obtain a CT of the cervical spine. The CT was unremarkable. The right shoulder pain had improved at the time of discharge but persist. He is at [facility] to work with PT and OT. There are multiple insulins on discharge list. Pt reports he was taking LANTUS 30 units qhs at home with Humalog SSI. Also reports he has been constipated and was on bowel regimen in the hospital. Not currently on medication for bowels. Also was getting pain medication more frequently which was more effective to relief pain in shoulder. Also with increase in allergies. Taking medications at home that have not carried over.   Review of Systems:  Review of Systems  Constitutional: Negative for activity change, appetite change, fatigue and unexpected weight change.  HENT: Negative for congestion and hearing loss.   Eyes: Negative.   Respiratory: Negative for cough and shortness of breath.   Cardiovascular: Negative for chest pain, palpitations and leg swelling.  Gastrointestinal: Negative for abdominal pain, diarrhea and constipation.  Genitourinary: Negative for dysuria and difficulty urinating.  Musculoskeletal: Positive for myalgias and arthralgias.  Skin: Negative for color change and wound.  Allergic/Immunologic: Positive for environmental allergies.  Neurological: Negative for dizziness  and weakness.  Psychiatric/Behavioral: Negative for behavioral problems, confusion and agitation.    Past Medical History  Diagnosis Date  . Diabetes mellitus without complication (HCC)   . Hypertension   . Neuropathy (HCC)   . Thrombocytopenia (HCC)   . Depression   . Anxiety   . Neuromuscular disorder (HCC)     peripheral neuropathy  . Thrombocytopenia (HCC)   . Splenomegaly   . Hyperlipidemia associated with type 2 diabetes mellitus (HCC)   . Sleep apnea     uses CPAP   Past Surgical History  Procedure Laterality Date  . Anterior cruciate ligament repair    . Anterior cervical decomp/discectomy fusion N/A 02/21/2016    Procedure: ANTERIOR CERVICAL DECOMPRESSION/DISCECTOMY FUSION 1 LEVEL;  Surgeon: Coletta Memos, MD;  Location: [facility];  Service: Neurosurgery;  Laterality: N/A;  C67 anterior cervical decompression with fusion plating and bonegraft   Social History:   reports that he has never smoked. He does not have any smokeless tobacco history on file. He reports that he drinks alcohol. He reports that he does not use illicit drugs.  History reviewed. No pertinent family history.  Medications: Patient's Medications  New Prescriptions   No medications on file  Previous Medications   ALPRAZOLAM (XANAX) 0.25 MG TABLET    Take 1 tablet (0.25 mg total) by mouth 3 (three) times daily as needed for anxiety.   ALPRAZOLAM (XANAX) 0.5 MG TABLET    Take 0.5 mg by mouth 2 (two) times daily as needed for anxiety.   CHOLECALCIFEROL (VITAMIN D) 1000 UNITS TABLET    Take 1,000 Units by mouth daily.   EZETIMIBE-SIMVASTATIN (  VYTORIN) 10-40 MG PER TABLET    Take 1 tablet by mouth daily.   INSULIN DETEMIR (LEVEMIR) 100 UNIT/ML INJECTION    Inject 60 Units into the skin 2 (two) times daily.   INSULIN GLARGINE (LANTUS) 100 UNIT/ML INJECTION    Inject 30 Units into the skin at bedtime.   INSULIN LISPRO (HUMALOG) 100 UNIT/ML INJECTION    Inject 10-120 Units into the skin 3 (three) times  daily.    METFORMIN (GLUCOPHAGE) 500 MG TABLET    Take 1,000 mg by mouth 2 (two) times daily with a meal.   METHOCARBAMOL (ROBAXIN) 500 MG TABLET    Take 2 tablets (1,000 mg total) by mouth 4 (four) times daily as needed for muscle spasms (muscle spasm/pain).   MULTIPLE VITAMINS-MINERALS (MULTIVITAMIN) LIQD    Take 15 mLs by mouth daily.   OXYCODONE-ACETAMINOPHEN (PERCOCET/ROXICET) 5-325 MG TABLET    Take 2 tablets by mouth every 6 (six) hours as needed for moderate pain or severe pain.   PREGABALIN (LYRICA) 300 MG CAPSULE    Take 300 mg by mouth at bedtime.   VALSARTAN-HYDROCHLOROTHIAZIDE (DIOVAN HCT) 160-12.5 MG PER TABLET    Take 1 tablet by mouth daily.   VITAMIN E 400 UNIT CAPSULE    Take 400 Units by mouth daily.  Modified Medications   No medications on file  Discontinued Medications   INSULIN ASPART (NOVOLOG) 100 UNIT/ML INJECTION    Inject 60-90 Units into the skin 3 (three) times daily. Sliding scale     Physical Exam: Filed Vitals:   02/28/16 1435  BP: 148/78  Pulse: 87  Temp: 97 F (36.1 C)  TempSrc: Oral  Resp: 18  Height: 6\' 1"  (1.854 m)  Weight: 284 lb 4.8 oz (128.958 kg)    Physical Exam  Constitutional: He is oriented to person, place, and time. He appears well-developed and well-nourished. No distress.  HENT:  Head: Normocephalic and atraumatic.  Mouth/Throat: Oropharynx is clear and moist. No oropharyngeal exudate.  Eyes: Conjunctivae and EOM are normal. Pupils are equal, round, and reactive to light.  Neck: Normal range of motion. Neck supple.  Cardiovascular: Normal rate, regular rhythm and normal heart sounds.   Pulmonary/Chest: Effort normal and breath sounds normal.  Abdominal: Soft. Bowel sounds are normal.  Musculoskeletal: He exhibits tenderness (to left shoulder with decrease ROM). He exhibits no edema.  Neurological: He is alert and oriented to person, place, and time.  Skin: Skin is warm and dry. He is not diaphoretic.  Anterior neck incision,  healing well from surgery.   Psychiatric: He has a normal mood and affect.    Labs reviewed: Basic Metabolic Panel:  Recent Labs  41/32/4401/07/13 2306 02/19/16 1727  NA 138 139  K 3.9 3.9  CL 103 102  CO2 26 23  GLUCOSE 176* 153*  BUN 12 17  CREATININE 0.66 0.71  CALCIUM 8.6* 9.3   Liver Function Tests:  Recent Labs  02/04/16 2306 02/19/16 1727  AST 31 34  ALT 34 34  ALKPHOS 108 115  BILITOT 1.6* 0.7  PROT 7.0 6.9  ALBUMIN 3.9 3.9   No results for input(s): LIPASE, AMYLASE in the last 8760 hours. No results for input(s): AMMONIA in the last 8760 hours. CBC:  Recent Labs  02/04/16 2306 02/19/16 1727  WBC 9.1 7.0  NEUTROABS 5.8  --   HGB 16.0 15.8  HCT 46.7 44.2  MCV 88.4 89.5  PLT 95* 92*   TSH: No results for input(s): TSH in the last 8760  hours. A1C: Lab Results  Component Value Date   HGBA1C 7.5* 02/19/2016   Lipid Panel: No results for input(s): CHOL, HDL, LDLCALC, TRIG, CHOLHDL, LDLDIRECT in the last 8760 hours.   Assessment/Plan 1. Essential hypertension Stable, conts on valsartan-hctz. Will monitor  2. Type 2 diabetes mellitus with other diabetic kidney complication (HCC) -CLARIFICATION of medication- pt should be getting lantus 30 untis qhs, humalog 5 units for blood sugars 80-150 with means, 10 units for blood sugars 151-300 with meals and 15 units for blood sguars 301 and greater with meals. All other insulin orders to be stopped. cbgs ACHS  3. Diabetic peripheral neuropathy (HCC) -stable on lyrica  4. Stenosis of cervical spine with myelopathy -s/p anterior cervical decompression. Pain well controlled on current regimen  5. Shoulder pain -will increase pain medication to q 4 hours PRN, max of 9 tablets in 24 hours. conts to work with therapy  6. Allergic rhinitis due to pollen Claritin 10 mg po daily Flonase 1 spray into bilateral nares daily  7. Constipation Colace 100 mg daily with miralax 17 gm every other day -to increase fluid  intake and increase activity.      Janene Harvey. Biagio Borg  [facility] [phone] 8 am - 5 pm) [phone] (after hours)

## 2016-03-02 ENCOUNTER — Encounter: Payer: Self-pay | Admitting: Internal Medicine

## 2016-03-02 ENCOUNTER — Non-Acute Institutional Stay (SKILLED_NURSING_FACILITY): Payer: Commercial Managed Care - PPO | Admitting: Internal Medicine

## 2016-03-02 DIAGNOSIS — F32A Depression, unspecified: Secondary | ICD-10-CM

## 2016-03-02 DIAGNOSIS — F418 Other specified anxiety disorders: Secondary | ICD-10-CM | POA: Diagnosis not present

## 2016-03-02 DIAGNOSIS — Z9889 Other specified postprocedural states: Secondary | ICD-10-CM | POA: Diagnosis not present

## 2016-03-02 DIAGNOSIS — Z9989 Dependence on other enabling machines and devices: Secondary | ICD-10-CM

## 2016-03-02 DIAGNOSIS — I10 Essential (primary) hypertension: Secondary | ICD-10-CM | POA: Diagnosis not present

## 2016-03-02 DIAGNOSIS — F419 Anxiety disorder, unspecified: Secondary | ICD-10-CM

## 2016-03-02 DIAGNOSIS — E785 Hyperlipidemia, unspecified: Secondary | ICD-10-CM | POA: Diagnosis not present

## 2016-03-02 DIAGNOSIS — G4733 Obstructive sleep apnea (adult) (pediatric): Secondary | ICD-10-CM

## 2016-03-02 DIAGNOSIS — F329 Major depressive disorder, single episode, unspecified: Secondary | ICD-10-CM

## 2016-03-02 DIAGNOSIS — E1149 Type 2 diabetes mellitus with other diabetic neurological complication: Secondary | ICD-10-CM | POA: Insufficient documentation

## 2016-03-02 NOTE — Progress Notes (Signed)
Patient ID: Victor Collins, male   DOB: , 55 y.o.   MRN: [id number]   Review of Systems     Physical Exam
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
## 2016-03-04 ENCOUNTER — Non-Acute Institutional Stay (SKILLED_NURSING_FACILITY): Payer: Commercial Managed Care - PPO | Admitting: Nurse Practitioner

## 2016-03-04 ENCOUNTER — Encounter: Payer: Self-pay | Admitting: Nurse Practitioner

## 2016-03-04 DIAGNOSIS — I10 Essential (primary) hypertension: Secondary | ICD-10-CM | POA: Diagnosis not present

## 2016-03-04 DIAGNOSIS — E1142 Type 2 diabetes mellitus with diabetic polyneuropathy: Secondary | ICD-10-CM

## 2016-03-04 DIAGNOSIS — F418 Other specified anxiety disorders: Secondary | ICD-10-CM

## 2016-03-04 DIAGNOSIS — J301 Allergic rhinitis due to pollen: Secondary | ICD-10-CM | POA: Diagnosis not present

## 2016-03-04 DIAGNOSIS — K5901 Slow transit constipation: Secondary | ICD-10-CM

## 2016-03-04 DIAGNOSIS — G992 Myelopathy in diseases classified elsewhere: Secondary | ICD-10-CM

## 2016-03-04 DIAGNOSIS — F32A Depression, unspecified: Secondary | ICD-10-CM

## 2016-03-04 DIAGNOSIS — E785 Hyperlipidemia, unspecified: Secondary | ICD-10-CM

## 2016-03-04 DIAGNOSIS — E1149 Type 2 diabetes mellitus with other diabetic neurological complication: Secondary | ICD-10-CM

## 2016-03-04 DIAGNOSIS — M25511 Pain in right shoulder: Secondary | ICD-10-CM | POA: Diagnosis not present

## 2016-03-04 DIAGNOSIS — M4802 Spinal stenosis, cervical region: Principal | ICD-10-CM

## 2016-03-04 DIAGNOSIS — F329 Major depressive disorder, single episode, unspecified: Secondary | ICD-10-CM

## 2016-03-04 DIAGNOSIS — F419 Anxiety disorder, unspecified: Secondary | ICD-10-CM

## 2016-03-04 DIAGNOSIS — M4712 Other spondylosis with myelopathy, cervical region: Secondary | ICD-10-CM | POA: Diagnosis not present

## 2016-03-04 NOTE — Progress Notes (Signed)
Patient ID: Victor Collins, male   DOB: [birth year], 55 y.o.   MRN: [id number]    Nursing Home Location:  [facility] : [facility]   Place of Service: SNF (31)  PCP: No PCP Per Patient  No Known Allergies  Chief Complaint  Patient presents with  . Discharge Note    Discharge from facility    HPI:  Patient is a 55 y.o. male seen [facility] for [facility] home.  [facility] for anterior cervical decompression of C6/7 due to cervical cord compression causing cervical myelopathy. Post op he had complained of severe pain in the right shoulder which prompted me to obtain a CT of the cervical spine. The CT was unremarkable. The right shoulder pain had improved at the time of discharge but persist. [facility] and OT. Bowels doing well on current regimen. Pt saw psych services about depression and was started on Wellbutrin and is excited about this medication to help him his mood. Pain is under good control with current regimen.  Patient currently doing well with therapy, now stable to discharge home with home health.  Review of Systems:  Review of Systems  Constitutional: Negative for activity change, appetite change, fatigue and unexpected weight change.  HENT: Negative for congestion and hearing loss.   Eyes: Negative.   Respiratory: Negative for cough and shortness of breath.   Cardiovascular: Negative for chest pain, palpitations and leg swelling.  Gastrointestinal: Negative for abdominal pain, diarrhea and constipation.  Genitourinary: Negative for dysuria and difficulty urinating.  Musculoskeletal: Positive for myalgias and arthralgias.  Skin: Negative for color change and wound.  Allergic/Immunologic: Positive for environmental allergies.  Neurological: Negative for dizziness and weakness.  Psychiatric/Behavioral: Negative for behavioral problems, confusion and agitation.    Past Medical History  Diagnosis  Date  . Diabetes mellitus without complication (HCC)   . Hypertension   . Neuropathy (HCC)   . Thrombocytopenia (HCC)   . Depression   . Anxiety   . Neuromuscular disorder (HCC)     peripheral neuropathy  . Thrombocytopenia (HCC)   . Splenomegaly   . Hyperlipidemia associated with type 2 diabetes mellitus (HCC)   . Sleep apnea     uses CPAP   Past Surgical History  Procedure Laterality Date  . Anterior cruciate ligament repair    . Anterior cervical decomp/discectomy fusion N/A 02/21/2016    Procedure: ANTERIOR CERVICAL DECOMPRESSION/DISCECTOMY FUSION 1 LEVEL;  Surgeon: Coletta MemosKyle Cabbell, MD;  Location: [facility];  Service: Neurosurgery;  Laterality: N/A;  C67 anterior cervical decompression with fusion plating and bonegraft   Social History:   reports that he has never smoked. He does not have any smokeless tobacco history on file. He reports that he drinks alcohol. He reports that he does not use illicit drugs.  History reviewed. No pertinent family history.  Medications: Patient's Medications  New Prescriptions   No medications on file  Previous Medications   ALPRAZOLAM (XANAX) 0.25 MG TABLET    Take 0.25 mg by mouth 3 (three) times daily as needed for anxiety.   ALPRAZOLAM (XANAX) 0.5 MG TABLET    Take 0.5 mg by mouth 2 (two) times daily as needed for anxiety.   BUPROPION (WELLBUTRIN XL) 300 MG 24 HR TABLET    Give 150mg  by mouth each day for 3 days (03-03-16 to 03-06-16), then increase to 300mg  each day.   CHOLECALCIFEROL (VITAMIN D) 1000 UNITS TABLET  Take 1,000 Units by mouth daily.   DOCUSATE SODIUM (COLACE) 100 MG CAPSULE    Take 100 mg by mouth daily.   EZETIMIBE-SIMVASTATIN (VYTORIN) 10-40 MG TABLET    Take 1 tablet by mouth daily. For cholesterol   FLUTICASONE (FLONASE) 50 MCG/ACT NASAL SPRAY    Place 1 spray into both nostrils daily.   INSULIN GLARGINE (LANTUS) 100 UNIT/ML INJECTION    Inject 30 Units into the skin at bedtime.   INSULIN LISPRO (HUMALOG) 100 UNIT/ML  INJECTION    Give 5 units for cbg 80-150, 10 units for 151-300, 15 units for cbg greater than 300 with meals.   LORATADINE (CLARITIN) 10 MG TABLET    Take 10 mg by mouth daily.   METFORMIN (GLUCOPHAGE) 500 MG TABLET    Take 1,000 mg by mouth 2 (two) times daily with a meal.   METHOCARBAMOL (ROBAXIN) 500 MG TABLET    Take 2 tablets (1,000 mg total) by mouth 4 (four) times daily as needed for muscle spasms (muscle spasm/pain).   MULTIPLE VITAMINS-MINERALS (MULTIVITAMIN) LIQD    Take 15 mLs by mouth daily.   OXYCODONE-ACETAMINOPHEN (PERCOCET/ROXICET) 5-325 MG TABLET    1-2 tablets by mouth every 4 hours as needed. Maximum of 9 tablets in 24 hours.   POLYETHYLENE GLYCOL (MIRALAX / GLYCOLAX) PACKET    Take 17 g by mouth. Every other day.   PREGABALIN (LYRICA) 300 MG CAPSULE    Take 300 mg by mouth at bedtime.   VALSARTAN-HYDROCHLOROTHIAZIDE (DIOVAN-HCT) 160-12.5 MG TABLET    Take 1 tablet by mouth daily.   VITAMIN E 400 UNIT CAPSULE    Take 400 Units by mouth daily.  Modified Medications   No medications on file  Discontinued Medications   INSULIN LISPRO (HUMALOG) 100 UNIT/ML INJECTION    Inject 10-120 Units into the skin 3 (three) times daily.    INSULIN NPH HUMAN (NOVOLIN N) 100 UNIT/ML INJECTION    Use CCM sliding scale.   OXYCODONE-ACETAMINOPHEN (PERCOCET/ROXICET) 5-325 MG TABLET    Take 2 tablets by mouth every 6 (six) hours as needed for moderate pain or severe pain.   OXYCODONE-ACETAMINOPHEN (PERCOCET/ROXICET) 5-325 MG TABLET    Take 1 tablet by mouth every 6 (six) hours as needed for moderate pain.     Physical Exam: Filed Vitals:   03/04/16 1309  BP: 124/67  Pulse: 77  Temp: 97.6 F (36.4 C)  TempSrc: Oral  Resp: 20  Height:  (1.854 m)  Weight: 284 lb (128.822 kg)    Physical Exam  Constitutional: He is oriented to person, place, and time. He appears well-developed and well-nourished. No distress.  HENT:  Head: Normocephalic and atraumatic.  Mouth/Throat: Oropharynx is  clear and moist. No oropharyngeal exudate.  Eyes: Conjunctivae and EOM are normal. Pupils are equal, round, and reactive to light.  Neck: Normal range of motion. Neck supple.  Cardiovascular: Normal rate, regular rhythm and normal heart sounds.   Pulmonary/Chest: Effort normal and breath sounds normal.  Abdominal: Soft. Bowel sounds are normal.  Musculoskeletal: He exhibits tenderness (to left shoulder with decrease ROM). He exhibits no edema.  Neurological: He is alert and oriented to person, place, and time.  Skin: Skin is warm and dry. He is not diaphoretic.  Anterior neck incision, healing well from surgery.   Psychiatric: He has a normal mood and affect.    Labs reviewed: Basic Metabolic Panel:  Recent Labs  18/56/31 2306 02/19/16 1727  NA 138 139  K 3.9 3.9  CL  103 102  CO2 26 23  GLUCOSE 176* 153*  BUN 12 17  CREATININE 0.66 0.71  CALCIUM 8.6* 9.3   Liver Function Tests:  Recent Labs  02/04/16 2306 02/19/16 1727  AST 31 34  ALT 34 34  ALKPHOS 108 115  BILITOT 1.6* 0.7  PROT 7.0 6.9  ALBUMIN 3.9 3.9   No results for input(s): LIPASE, AMYLASE in the last 8760 hours. No results for input(s): AMMONIA in the last 8760 hours. CBC:  Recent Labs  02/04/16 2306 02/19/16 1727  WBC 9.1 7.0  NEUTROABS 5.8  --   HGB 16.0 15.8  HCT 46.7 44.2  MCV 88.4 89.5  PLT 95* 92*   TSH: No results for input(s): TSH in the last 8760 hours. A1C: Lab Results  Component Value Date   HGBA1C 7.5* 02/19/2016   Lipid Panel: No results for input(s): CHOL, HDL, LDLCALC, TRIG, CHOLHDL, LDLDIRECT in the last 8760 hours.   Assessment/Plan 1. Diabetes mellitus type 2 with neurological manifestations (HCC) Stable, cont current lantus and Humalog dose.   2. Stenosis of cervical spine with myelopathy -pain well controlled. Surgeon has released pt to go back to work. Ongoing follow up by neurosurgery. Cont pain medication has needed  3. Anxiety and depression -started on  Wellbutrin per psych due to depression will cont on discharge and pt to follow up with PCP for ongoing treatment. Pt also to stop taking if adverse effects occur (HI or SI) and notify provider. Also encouraged to go to therapy for best outcomes. To cont xanax 0.5 mg BID PRN  4. Seasonal allergic rhinitis due to pollen Stable on Flonase and claritin  5. Slow transit constipation -improved on current regiemen  6. Diabetic peripheral neuropathy (HCC) conts lyrica 300 mg daily  7. Pain in joint of right shoulder Improved. Cont percocet PRN  8. Essential hypertension Blood pressure stable. conts on   9. Hyperlipidemia LDL goal <100 -to cont vytorin and follow up with PCP  pt is stable for discharge-will need PT/OT per home health. DME needed includes rolling walker. Rx written.  will need to follow up with PCP within 2 weeks.      Janene Harvey. Biagio Borg  [facility] [phone] 8 am - 5 pm) [phone] (after hours)

## 2016-03-04 NOTE — Progress Notes (Deleted)
Patient ID: Victor Collins, male   DOB: [birth year], 55 y.o.   MRN: [id number]    Nursing [facility]  [facility]   Place of Service: SNF (31)  PCP: No PCP Per Patient  No Known Allergies  Chief Complaint  Patient presents with  . Discharge Note    Discharge from facility    HPI:  Patient is a 55 y.o. male [facility] today at [facility] ***  Review of Systems:  Review of Systems  Past Medical History  Diagnosis Date  . Diabetes mellitus without complication (HCC)   . Hypertension   . Neuropathy (HCC)   . Thrombocytopenia (HCC)   . Depression   . Anxiety   . Neuromuscular disorder (HCC)     peripheral neuropathy  . Thrombocytopenia (HCC)   . Splenomegaly   . Hyperlipidemia associated with type 2 diabetes mellitus (HCC)   . Sleep apnea     uses CPAP   Past Surgical History  Procedure Laterality Date  . Anterior cruciate ligament repair    . Anterior cervical decomp/discectomy fusion N/A 02/21/2016    Procedure: ANTERIOR CERVICAL DECOMPRESSION/DISCECTOMY FUSION 1 LEVEL;  Surgeon: Coletta MemosKyle Cabbell, MD;  Location: [facility];  Service: Neurosurgery;  Laterality: N/A;  C67 anterior cervical decompression with fusion plating and bonegraft   Social History:   reports that he has never smoked. He does not have any smokeless tobacco history on file. He reports that he drinks alcohol. He reports that he does not use illicit drugs.  History reviewed. No pertinent family history.  Medications: Patient's Medications  New Prescriptions   No medications on file  Previous Medications   ALPRAZOLAM (XANAX) 0.25 MG TABLET    Take 0.25 mg by mouth 3 (three) times daily as needed for anxiety.   ALPRAZOLAM (XANAX) 0.5 MG TABLET    Take 0.5 mg by mouth 2 (two) times daily as needed for anxiety.   BUPROPION (WELLBUTRIN XL) 300 MG 24 HR TABLET    Give 150mg  by mouth each day for 3 days (03-03-16 to 03-06-16), then increase to 300mg  each day.   CHOLECALCIFEROL  (VITAMIN D) 1000 UNITS TABLET    Take 1,000 Units by mouth daily.   DOCUSATE SODIUM (COLACE) 100 MG CAPSULE    Take 100 mg by mouth daily.   EZETIMIBE-SIMVASTATIN (VYTORIN) 10-40 MG TABLET    Take 1 tablet by mouth daily. For cholesterol   FLUTICASONE (FLONASE) 50 MCG/ACT NASAL SPRAY    Place 1 spray into both nostrils daily.   INSULIN GLARGINE (LANTUS) 100 UNIT/ML INJECTION    Inject 30 Units into the skin at bedtime.   INSULIN LISPRO (HUMALOG) 100 UNIT/ML INJECTION    Give 5 units for cbg 80-150, 10 units for 151-300, 15 units for cbg greater than 300 with meals.   LORATADINE (CLARITIN) 10 MG TABLET    Take 10 mg by mouth daily.   METFORMIN (GLUCOPHAGE) 500 MG TABLET    Take 1,000 mg by mouth 2 (two) times daily with a meal.   METHOCARBAMOL (ROBAXIN) 500 MG TABLET    Take 2 tablets (1,000 mg total) by mouth 4 (four) times daily as needed for muscle spasms (muscle spasm/pain).   MULTIPLE VITAMINS-MINERALS (MULTIVITAMIN) LIQD    Take 15 mLs by mouth daily.   OXYCODONE-ACETAMINOPHEN (PERCOCET/ROXICET) 5-325 MG TABLET    1-2 tablets by mouth every 4 hours as needed. Maximum of 9 tablets in 24 hours.   POLYETHYLENE GLYCOL (MIRALAX / GLYCOLAX) PACKET  Take 17 g by mouth. Every other day.   PREGABALIN (LYRICA) 300 MG CAPSULE    Take 300 mg by mouth at bedtime.   VALSARTAN-HYDROCHLOROTHIAZIDE (DIOVAN-HCT) 160-12.5 MG TABLET    Take 1 tablet by mouth daily.   VITAMIN E 400 UNIT CAPSULE    Take 400 Units by mouth daily.  Modified Medications   No medications on file  Discontinued Medications   INSULIN LISPRO (HUMALOG) 100 UNIT/ML INJECTION    Inject 10-120 Units into the skin 3 (three) times daily.    INSULIN NPH HUMAN (NOVOLIN N) 100 UNIT/ML INJECTION    Use CCM sliding scale.   OXYCODONE-ACETAMINOPHEN (PERCOCET/ROXICET) 5-325 MG TABLET    Take 2 tablets by mouth every 6 (six) hours as needed for moderate pain or severe pain.   OXYCODONE-ACETAMINOPHEN (PERCOCET/ROXICET) 5-325 MG TABLET    Take 1  tablet by mouth every 6 (six) hours as needed for moderate pain.     Physical Exam: Filed Vitals:   03/04/16 1309  BP: 124/67  Pulse: 77  Temp: 97.6 F (36.4 C)  TempSrc: Oral  Resp: 20  Height:  (1.854 m)  Weight: 284 lb (128.822 kg)    Physical Exam  Labs reviewed: Basic Metabolic Panel:  Recent Labs  16/10/96 2306 02/19/16 1727  NA 138 139  K 3.9 3.9  CL 103 102  CO2 26 23  GLUCOSE 176* 153*  BUN 12 17  CREATININE 0.66 0.71  CALCIUM 8.6* 9.3   Liver Function Tests:  Recent Labs  02/04/16 2306 02/19/16 1727  AST 31 34  ALT 34 34  ALKPHOS 108 115  BILITOT 1.6* 0.7  PROT 7.0 6.9  ALBUMIN 3.9 3.9   No results for input(s): LIPASE, AMYLASE in the last 8760 hours. No results for input(s): AMMONIA in the last 8760 hours. CBC:  Recent Labs  02/04/16 2306 02/19/16 1727  WBC 9.1 7.0  NEUTROABS 5.8  --   HGB 16.0 15.8  HCT 46.7 44.2  MCV 88.4 89.5  PLT 95* 92*   TSH: No results for input(s): TSH in the last 8760 hours. A1C: Lab Results  Component Value Date   HGBA1C 7.5* 02/19/2016   Lipid Panel: No results for input(s): CHOL, HDL, LDLCALC, TRIG, CHOLHDL, LDLDIRECT in the last 8760 hours.  Radiological Exams: ***  Assessment/Plan There are no diagnoses linked to this encounter.    Janene Harvey. Biagio Borg  [facility] [phone] 8 am - 5 pm) [phone] (after hours)

## 2016-12-09 ENCOUNTER — Other Ambulatory Visit: Payer: Self-pay | Admitting: Nurse Practitioner
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
